# Patient Record
Sex: Male | Born: 1955 | Race: Black or African American | Hispanic: No | Marital: Married | Smoking: Never smoker
Health system: Southern US, Community
[De-identification: ages and names within clinical notes are randomized; demographics above are authoritative.]

## PROBLEM LIST (undated history)

## (undated) DIAGNOSIS — I10 Essential (primary) hypertension: Secondary | ICD-10-CM

## (undated) HISTORY — PX: ACHILLES TENDON REPAIR: SUR1153

---

## 2002-03-10 ENCOUNTER — Emergency Department (HOSPITAL_COMMUNITY): Admission: EM | Admit: 2002-03-10 | Discharge: 2002-03-10 | Payer: Self-pay | Admitting: Emergency Medicine

## 2002-07-10 ENCOUNTER — Encounter: Payer: Self-pay | Admitting: Emergency Medicine

## 2002-07-10 ENCOUNTER — Emergency Department (HOSPITAL_COMMUNITY): Admission: EM | Admit: 2002-07-10 | Discharge: 2002-07-10 | Payer: Self-pay | Admitting: Emergency Medicine

## 2006-04-24 ENCOUNTER — Ambulatory Visit (HOSPITAL_COMMUNITY): Admission: RE | Admit: 2006-04-24 | Discharge: 2006-04-25 | Payer: Self-pay | Admitting: Specialist

## 2007-05-18 ENCOUNTER — Emergency Department (HOSPITAL_COMMUNITY): Admission: EM | Admit: 2007-05-18 | Discharge: 2007-05-18 | Payer: Self-pay | Admitting: Emergency Medicine

## 2008-07-29 ENCOUNTER — Emergency Department (HOSPITAL_BASED_OUTPATIENT_CLINIC_OR_DEPARTMENT_OTHER): Admission: EM | Admit: 2008-07-29 | Discharge: 2008-07-29 | Payer: Self-pay | Admitting: Emergency Medicine

## 2008-12-08 ENCOUNTER — Emergency Department (HOSPITAL_BASED_OUTPATIENT_CLINIC_OR_DEPARTMENT_OTHER): Admission: EM | Admit: 2008-12-08 | Discharge: 2008-12-08 | Payer: Self-pay | Admitting: Emergency Medicine

## 2009-01-19 ENCOUNTER — Emergency Department (HOSPITAL_BASED_OUTPATIENT_CLINIC_OR_DEPARTMENT_OTHER): Admission: EM | Admit: 2009-01-19 | Discharge: 2009-01-19 | Payer: Self-pay | Admitting: Emergency Medicine

## 2009-01-19 ENCOUNTER — Ambulatory Visit: Payer: Self-pay | Admitting: Diagnostic Radiology

## 2009-09-07 ENCOUNTER — Emergency Department (HOSPITAL_BASED_OUTPATIENT_CLINIC_OR_DEPARTMENT_OTHER): Admission: EM | Admit: 2009-09-07 | Discharge: 2009-09-07 | Payer: Self-pay | Admitting: Emergency Medicine

## 2011-03-07 NOTE — Op Note (Signed)
NAME:  Leonard Osborn, Leonard Osborn               ACCOUNT NO.:  [id number]   MEDICAL RECORD NO.:  [id number]          PATIENT TYPE:  AMB   LOCATION:  SDS                          FACILITY:  [facility]   PHYSICIAN:  Kerrin Champagne, M.D.   DATE OF BIRTH:  07/13/1956   DATE OF PROCEDURE:  04/24/2006  DATE OF DISCHARGE:                                 OPERATIVE REPORT   PREOPERATIVE DIAGNOSIS:  Left tendo-Achilles rupture.   POSTOPERATIVE DIAGNOSIS:  Left tendo-Achilles rupture.   PROCEDURE:  Repair of left tendo-Achilles rupture using two #5 FiberWire  modified Kessler sutures and 0 Ethibond suture.   SURGEON:  Kerrin Champagne, M.D.   ASSISTANT:  None.   ANESTHESIA:  General via orotracheal intubation, Dr. Jacklynn Bue.   SPECIMENS:  None.   TOTAL TOURNIQUET TIME:  60 minutes at 300 mmHg.   ESTIMATED BLOOD LOSS:  50 mL   COMPLICATIONS:  None.   BRIEF CLINICAL HISTORY:  The patient is a 55 year old male playing  basketball five days ago, coming down from a shot, he felt a sudden give in  his left posterior ankle and had difficulty pushing off, thereafter, with a  noticeable defect in the left tendo-Achilles.  He was seen in the emergency  room and referred for evaluation.  Findings consistent with a left tendo-  Achilles rupture with defect in the left Achilles tendon about 4-6 inches  proximal to the calcaneus and positive Thompson's test, continuity of the  tendon.  The patient does, however, have plantar flexion related to the use  of his toe flexors.   INTEROPERATIVE FINDINGS:  A torn left tendo-Achilles approximately 4 inches  proximal to the attachment to the calcaneus.  This was repaired with two  modified Kesslers in the AP and the medial lateral plane.  Then, 0 Ethibond  sutures, repairing the toward edges together.  The plantaris tendon was used  to reinforce the repair.   DESCRIPTION OF PROCEDURE:  After adequate general anesthesia, the patient  rolled to a prone position, chest rolls  were used, standard preoperative  antibiotics.  All pressure points well-padded.  A tourniquet about the left  upper thigh.  Standard prep with DuraPrep solution from the left toes to the  knee, draped in the usual manner.  The incision, a curved S incision  extending from the medial aspect of the distal calf just above the  calcaneus, across the posterior aspect of the tendo-Achilles along the  posterior lateral aspect of the tendo-Achilles proximally, incision total  length approximately 8 inches, through skin and subcutaneous layers and  carried directly down to the peri-tenon and this was incised in line with  the skin.  The tendon distally and proximally was then carefully freed up.  The peri-tenon attachments left in place.  After freeing up the torn edges,  a #5 FiberWire was then passed in a modified Kessler fashion in the anterior  to posterior plane capturing the distal fragment and then another was passed  in the AP plane capturing the proximal fragment.  Then, #5 FiberWire's were  then passed in a medial to lateral  direction, again, placing a modified  Kessler stitch in this position in the distal stump and then in the proximal  stump, as well.  Then holding the foot plantar flex, the anterior to  posterior sutures were then carefully approximated and tied, maintaining  equal tension on each of the modified Kessler sutures and approximating the  tendinous structures.  Then, the medial and lateral sutures were carefully  tightened and sutured home.  This reapproximated the tendon quite nicely.  The slips of tendon that had been torn in a mop-like fashion were then  approximated to each of the ends overlapping the repaired area using 0  Ethibond sutures.  This was done circumferentially.  After completion of  this, the plantaris tendon was identified along the medial aspect of the  patient's tendo-Achilles. This was then carefully freed up distally and  passed through a hole in  the distal stump after making an opening with a 15  blade scalpel, passing it through the stump using a hemostat.  This was then  tied to the proximal stump using interrupted 0 Ethibond sutures and then  sutured to the distal stump, both over the lateral and medial aspects of the  stump using 0 Ethibond sutures and then proximally on the medial side  sutured to the proximal stump using 0 Ethibond sutures.  The peri-tenon was  then carefully closed over the repaired Achilles tendon area.  Irrigation  was performed and the subcu layers approximated with interrupted 0 Vicryl  sutures, more superficial subcutaneous layers with interrupted 2-0 Vicryl  sutures, and the skin closed with a running subcu stitch of 4-0 Vicryl.  Dermabond was then applied.  4x4s and ABD pad affixed to the skin with  sterile Webril and then a well padded posterior short leg splint was applied  with the ankle in approximately 20-25 degrees of plantar flexion.  After  application of this, the tourniquet was released.  Total tourniquet time was  60 minutes.  The patient was then returned to a supine position,  reactivated, extubated, and returned to the recovery room in satisfactory  condition.  All instrument and sponge counts were correct.      Kerrin Champagne, M.D.  Electronically Signed     JEN/MEDQ  D:  04/24/2006  T:  04/24/2006  Job:  [id number]

## 2011-07-22 LAB — RAPID STREP SCREEN (MED CTR MEBANE ONLY): Streptococcus, Group A Screen (Direct): NEGATIVE

## 2012-01-04 ENCOUNTER — Emergency Department (HOSPITAL_BASED_OUTPATIENT_CLINIC_OR_DEPARTMENT_OTHER)
Admission: EM | Admit: 2012-01-04 | Discharge: 2012-01-04 | Disposition: A | Discharge status: Home / Self Care | Payer: PRIVATE HEALTH INSURANCE | Attending: Emergency Medicine | Admitting: Emergency Medicine

## 2012-01-04 ENCOUNTER — Encounter (HOSPITAL_BASED_OUTPATIENT_CLINIC_OR_DEPARTMENT_OTHER): Payer: Self-pay | Admitting: *Deleted

## 2012-01-04 DIAGNOSIS — R0789 Other chest pain: Secondary | ICD-10-CM | POA: Insufficient documentation

## 2012-01-04 DIAGNOSIS — J45901 Unspecified asthma with (acute) exacerbation: Secondary | ICD-10-CM | POA: Insufficient documentation

## 2012-01-04 DIAGNOSIS — R0602 Shortness of breath: Secondary | ICD-10-CM | POA: Insufficient documentation

## 2012-01-04 DIAGNOSIS — I10 Essential (primary) hypertension: Secondary | ICD-10-CM | POA: Insufficient documentation

## 2012-01-04 DIAGNOSIS — R062 Wheezing: Secondary | ICD-10-CM

## 2012-01-04 HISTORY — DX: Essential (primary) hypertension: I10

## 2012-01-04 MED ORDER — ALBUTEROL SULFATE HFA 108 (90 BASE) MCG/ACT IN AERS
2.0000 | INHALATION_SPRAY | RESPIRATORY_TRACT | Status: DC | PRN
  Administered 2012-01-04: 2 via RESPIRATORY_TRACT
  Filled 2012-01-04: qty 6.7

## 2012-01-04 MED ORDER — PREDNISONE 20 MG PO TABS: 60.0000 mg | ORAL_TABLET | Freq: Every day | ORAL | Status: AC

## 2012-01-04 MED ORDER — ALBUTEROL SULFATE (5 MG/ML) 0.5% IN NEBU
5.0000 mg | INHALATION_SOLUTION | Freq: Once | RESPIRATORY_TRACT | Status: AC
  Administered 2012-01-04: 5 mg via RESPIRATORY_TRACT
  Filled 2012-01-04: qty 1

## 2012-01-04 MED ORDER — ALBUTEROL SULFATE HFA 108 (90 BASE) MCG/ACT IN AERS: 2.0000 | INHALATION_SPRAY | RESPIRATORY_TRACT | Status: DC | PRN

## 2012-01-04 NOTE — ED Notes (Signed)
Pt states that he feels much better after admin of neb tx, wants to go home.  Dr [place] Lank informed, d/c pending.

## 2012-01-04 NOTE — ED Notes (Signed)
Pt reports feeling sob for 3-4 days- maintenence meds not helping

## 2012-01-04 NOTE — ED Provider Notes (Signed)
History    This chart was scribed for Leonard Roots, MD, MD by Smitty Pluck. The patient was seen in room MH05 and the patient's care was started at 8:50PM.   CSN: [id number]  Arrival date & time 01/04/12  8657   First MD Initiated Contact with Patient 01/04/12 2051      Chief Complaint  Patient presents with  . Shortness of Breath    (Consider location/radiation/quality/duration/timing/severity/associated sxs/prior treatment) Patient is a 56 y.o. male presenting with shortness of breath. The history is provided by the patient.  Shortness of Breath  Associated symptoms include shortness of breath. Pertinent negatives include no chest pain and no sore throat.   Leonard Osborn is a 56 y.o. male who presents to the Emergency Department complaining of moderate SOB onset 3-4 days. Pt reports having cold recently. He has hx of asthma. Pt reports occasional mild coughing this morning. No fever.  He states that he feels tightwheezing in his chest. He denies chest pain, rhinorrhea and sore throat. He has never had to be admitted to hospital for asthma. He denies abdominal pain, diarrhea, nausea, vomiting and constipation. Pt reports that inhaler is not alleviating symptoms.  Of note pt has been using flovent as rescue inhaler and not experiencing relief of symptoms, he is out of albuterol. No leg pain or swelling. No orthopnea or pnd.   Past Medical History  Diagnosis Date  . Asthma   . Hypertension     Past Surgical History  Procedure Date  . Achilles tendon repair     No family history on file.  History  Substance Use Topics  . Smoking status: Never Smoker   . Smokeless tobacco: Never Used  . Alcohol Use: 1.2 oz/week    2 Glasses of wine per week      Review of Systems  HENT: Negative for sore throat.   Respiratory: Positive for shortness of breath.   Cardiovascular: Negative for chest pain and leg swelling.  All other systems reviewed and are negative.  10 Systems  reviewed and are negative for acute change except as noted in the HPI.   Allergies  Sulfa antibiotics  Home Medications  No current outpatient prescriptions on file.  BP 134/84  Pulse 60  Temp(Src) 97.6 F (36.4 C) (Oral)  Ht 6' 2.5" (1.892 m)  Wt 215 lb (97.523 kg)  BMI 27.23 kg/m2  SpO2 99%  Physical Exam  Nursing note and vitals reviewed. Constitutional: He is oriented to person, place, and time. He appears well-developed and well-nourished. No distress.  HENT:  Head: Normocephalic and atraumatic.  Mouth/Throat: Oropharynx is clear and moist.  Eyes: EOM are normal. Pupils are equal, round, and reactive to light.  Neck: Normal range of motion. Neck supple. No tracheal deviation present.  Cardiovascular: Normal rate, regular rhythm, normal heart sounds and intact distal pulses.  Exam reveals no gallop and no friction rub.   No murmur heard. Pulmonary/Chest: Effort normal. No accessory muscle usage. No respiratory distress. He has wheezes (mild expiratory ).       Mild exp wheezing bil. Good air exchange.   Abdominal: Soft. He exhibits no distension. There is no tenderness.  Musculoskeletal: Normal range of motion. He exhibits no edema and no tenderness.  Neurological: He is alert and oriented to person, place, and time.  Skin: Skin is warm and dry.  Psychiatric: He has a normal mood and affect. His behavior is normal.    ED Course  Procedures (including critical care time)  DIAGNOSTIC STUDIES: Oxygen Saturation is 99% on room air, normal by my interpretation.    COORDINATION OF CARE:      MDM  Albuterol neb. Recheck no wheezing. No increased wob.    Pt states breathing at baseline, requests d/c.       Leonard Roots, MD 01/04/12 2151

## 2012-01-04 NOTE — Discharge Instructions (Signed)
Use albuterol inhaler as need if wheezing. Take prednisone as prescribed.  Follow up with primary care doctor in the next couple days. Return to ER if worse, trouble breathing, fevers, chest pain, other concern.      Asthma Attack Prevention HOW CAN ASTHMA BE PREVENTED? Currently, there is no way to prevent asthma from starting. However, you can take steps to control the disease and prevent its symptoms after you have been diagnosed. Learn about your asthma and how to control it. Take an active role to control your asthma by working with your caregiver to create and follow an asthma action plan. An asthma action plan guides you in taking your medicines properly, avoiding factors that make your asthma worse, tracking your level of asthma control, responding to worsening asthma, and seeking emergency care when needed. To track your asthma, keep records of your symptoms, check your peak flow number using a peak flow meter (handheld device that shows how well air moves out of your lungs), and get regular asthma checkups.  Other ways to prevent asthma attacks include:  Use medicines as your caregiver directs.   Identify and avoid things that make your asthma worse (as much as you can).   Keep track of your asthma symptoms and level of control.   Get regular checkups for your asthma.   With your caregiver, write a detailed plan for taking medicines and managing an asthma attack. Then be sure to follow your action plan. Asthma is an ongoing condition that needs regular monitoring and treatment.   Identify and avoid asthma triggers. A number of outdoor allergens and irritants (pollen, mold, cold air, air pollution) can trigger asthma attacks. Find out what causes or makes your asthma worse, and take steps to avoid those triggers (see below).   Monitor your breathing. Learn to recognize warning signs of an attack, such as slight coughing, wheezing or shortness of breath. However, your lung function may  already decrease before you notice any signs or symptoms, so regularly measure and record your peak airflow with a home peak flow meter.   Identify and treat attacks early. If you act quickly, you're less likely to have a severe attack. You will also need less medicine to control your symptoms. When your peak flow measurements decrease and alert you to an upcoming attack, take your medicine as instructed, and immediately stop any activity that may have triggered the attack. If your symptoms do not improve, get medical help.   Pay attention to increasing quick-relief inhaler use. If you find yourself relying on your quick-relief inhaler (such as albuterol), your asthma is not under control. See your caregiver about adjusting your treatment.  IDENTIFY AND CONTROL FACTORS THAT MAKE YOUR ASTHMA WORSE A number of common things can set off or make your asthma symptoms worse (asthma triggers). Keep track of your asthma symptoms for several weeks, detailing all the environmental and emotional factors that are linked with your asthma. When you have an asthma attack, go back to your asthma diary to see which factor, or combination of factors, might have contributed to it. Once you know what these factors are, you can take steps to control many of them.  Allergies: If you have allergies and asthma, it is important to take asthma prevention steps at home. Asthma attacks (worsening of asthma symptoms) can be triggered by allergies, which can cause temporary increased inflammation of your airways. Minimizing contact with the substance to which you are allergic will help prevent an asthma attack.  Animal Dander:   Some people are allergic to the flakes of skin or dried saliva from animals with fur or feathers. Keep these pets out of your home.   If you can't keep a pet outdoors, keep the pet out of your bedroom and other sleeping areas at all times, and keep the door closed.   Remove carpets and furniture covered with  cloth from your home. If that is not possible, keep the pet away from fabric-covered furniture and carpets.  Dust Mites:  Many people with asthma are allergic to dust mites. Dust mites are tiny bugs that are found in every home, in mattresses, pillows, carpets, fabric-covered furniture, bedcovers, clothes, stuffed toys, fabric, and other fabric-covered items.   Cover your mattress in a special dust-proof cover.   Cover your pillow in a special dust-proof cover, or wash the pillow each week in hot water. Water must be hotter than 130 F to kill dust mites. Cold or warm water used with detergent and bleach can also be effective.   Wash the sheets and blankets on your bed each week in hot water.   Try not to sleep or lie on cloth-covered cushions.   Call ahead when traveling and ask for a smoke-free hotel room. Bring your own bedding and pillows, in case the hotel only supplies feather pillows and down comforters, which may contain dust mites and cause asthma symptoms.   Remove carpets from your bedroom and those laid on concrete, if you can.   Keep stuffed toys out of the bed, or wash the toys weekly in hot water or cooler water with detergent and bleach.  Cockroaches:  Many people with asthma are allergic to the droppings and remains of cockroaches.   Keep food and garbage in closed containers. Never leave food out.   Use poison baits, traps, powders, gels, or paste (for example, boric acid).   If a spray is used to kill cockroaches, stay out of the room until the odor goes away.  Indoor Mold:  Fix leaky faucets, pipes, or other sources of water that have mold around them.   Clean moldy surfaces with a cleaner that has bleach in it.  Pollen and Outdoor Mold:  When pollen or mold spore counts are high, try to keep your windows closed.   Stay indoors with windows closed from late morning to afternoon, if you can. Pollen and some mold spore counts are highest at that time.   Ask  your caregiver whether you need to take or increase anti-inflammatory medicine before your allergy season starts.  Irritants:   Tobacco smoke is an irritant. If you smoke, ask your caregiver how you can quit. Ask family members to quit smoking, too. Do not allow smoking in your home or car.   If possible, do not use a wood-burning stove, kerosene heater, or fireplace. Minimize exposure to all sources of smoke, including incense, candles, fires, and fireworks.   Try to stay away from strong odors and sprays, such as perfume, talcum powder, hair spray, and paints.   Decrease humidity in your home and use an indoor air cleaning device. Reduce indoor humidity to below 60 percent. Dehumidifiers or central air conditioners can do this.   Try to have someone else vacuum for you once or twice a week, if you can. Stay out of rooms while they are being vacuumed and for a short while afterward.   If you vacuum, use a dust mask from a hardware store, a double-layered or  microfilter vacuum cleaner bag, or a vacuum cleaner with a HEPA filter.   Sulfites in foods and beverages can be irritants. Do not drink beer or wine, or eat dried fruit, processed potatoes, or shrimp if they cause asthma symptoms.   Cold air can trigger an asthma attack. Cover your nose and mouth with a scarf on cold or windy days.   Several health conditions can make asthma more difficult to manage, including runny nose, sinus infections, reflux disease, psychological stress, and sleep apnea. Your caregiver will treat these conditions, as well.   Avoid close contact with people who have a cold or the flu, since your asthma symptoms may get worse if you catch the infection from them. Wash your hands thoroughly after touching items that may have been handled by people with a respiratory infection.   Get a flu shot every year to protect against the flu virus, which often makes asthma worse for days or weeks. Also get a pneumonia shot once  every five to 10 years.  Drugs:  Aspirin and other painkillers can cause asthma attacks. 10% to 20% of people with asthma have sensitivity to aspirin or a group of painkillers called non-steroidal anti-inflammatory drugs (NSAIDS), such as ibuprofen and naproxen. These drugs are used to treat pain and reduce fevers. Asthma attacks caused by any of these medicines can be severe and even fatal. These drugs must be avoided in people who have known aspirin sensitive asthma. Products with acetaminophen are considered safe for people who have asthma. It is important that people with aspirin sensitivity read labels of all over-the-counter drugs used to treat pain, colds, coughs, and fever.   Beta blockers and ACE inhibitors are other drugs which you should discuss with your caregiver, in relation to your asthma.  ALLERGY SKIN TESTING  Ask your asthma caregiver about allergy skin testing or blood testing (RAST test) to identify the allergens to which you are sensitive. If you are found to have allergies, allergy shots (immunotherapy) for asthma may help prevent future allergies and asthma. With allergy shots, small doses of allergens (substances to which you are allergic) are injected under your skin on a regular schedule. Over a period of time, your body may become used to the allergen and less responsive with asthma symptoms. You can also take measures to minimize your exposure to those allergens. EXERCISE  If you have exercise-induced asthma, or are planning vigorous exercise, or exercise in cold, humid, or dry environments, prevent exercise-induced asthma by following your caregiver's advice regarding asthma treatment before exercising. Document Released: 09/24/2009 Document Revised: 09/25/2011 Document Reviewed: 09/24/2009 [facility] Patient Information 2012 [place], [place].      Asthma, Adult Asthma is caused by narrowing of the air passages in the lungs. It may be triggered by pollen, dust, animal  dander, molds, some foods, respiratory infections, exposure to smoke, exercise, emotional stress or other allergens (things that cause allergic reactions or allergies). Repeat attacks are common. HOME CARE INSTRUCTIONS   Use prescription medications as ordered by your caregiver.   Avoid pollen, dust, animal dander, molds, smoke and other things that cause attacks at home and at work.   You may have fewer attacks if you decrease dust in your home. Electrostatic air cleaners may help.   It may help to replace your pillows or mattress with materials less likely to cause allergies.   Talk to your caregiver about an action plan for managing asthma attacks at home, including, the use of a peak flow meter  which measures the severity of your asthma attack. An action plan can help minimize or stop the attack without having to seek medical care.   If you are not on a fluid restriction, drink 8 to 10 glasses of water each day.   Always have a plan prepared for seeking medical attention, including, calling your physician, accessing local emergency care, and calling 911 (in the [place] for a severe attack.   Discuss possible exercise routines with your caregiver.   If animal dander is the cause of asthma, you may need to get rid of pets.  SEEK MEDICAL CARE IF:   You have wheezing and shortness of breath even if taking medicine to prevent attacks.   You have muscle aches, chest pain or thickening of sputum.   Your sputum changes from clear or white to yellow, green, gray, or bloody.   You have any problems that may be related to the medicine you are taking (such as a rash, itching, swelling or trouble breathing).  SEEK IMMEDIATE MEDICAL CARE IF:   Your usual medicines do not stop your wheezing or there is increased coughing and/or shortness of breath.   You have increased difficulty breathing.   You have a fever.  MAKE SURE YOU:   Understand these instructions.   Will watch your condition.     Will get help right away if you are not doing well or get worse.  Document Released: 10/06/2005 Document Revised: 09/25/2011 Document Reviewed: 05/24/2008 [facility] Patient Information 2012 [place], [place].

## 2012-01-04 NOTE — ED Notes (Signed)
Pt presented to the ED with some [facility] but pt states that he was only given a Flovent inhaler which he is using as a recuse inhaler but is not getting relief. Pt states he was not given an Albuterol inhaler at the MD office. Pt was notified per [facility]RT while in ED  that the Flovent inhaler is a maintenance RX. Pt is no respiratory distress.

## 2012-04-26 ENCOUNTER — Encounter (HOSPITAL_BASED_OUTPATIENT_CLINIC_OR_DEPARTMENT_OTHER): Payer: Self-pay | Admitting: Family Medicine

## 2012-04-26 ENCOUNTER — Emergency Department (HOSPITAL_BASED_OUTPATIENT_CLINIC_OR_DEPARTMENT_OTHER)
Admission: EM | Admit: 2012-04-26 | Discharge: 2012-04-26 | Disposition: A | Discharge status: Home / Self Care | Payer: PRIVATE HEALTH INSURANCE | Attending: Emergency Medicine | Admitting: Emergency Medicine

## 2012-04-26 DIAGNOSIS — J45909 Unspecified asthma, uncomplicated: Secondary | ICD-10-CM | POA: Insufficient documentation

## 2012-04-26 DIAGNOSIS — H109 Unspecified conjunctivitis: Secondary | ICD-10-CM | POA: Insufficient documentation

## 2012-04-26 DIAGNOSIS — I10 Essential (primary) hypertension: Secondary | ICD-10-CM | POA: Insufficient documentation

## 2012-04-26 MED ORDER — TETRACAINE HCL 0.5 % OP SOLN
1.0000 [drp] | Freq: Once | OPHTHALMIC | Status: AC
  Administered 2012-04-26: 1 [drp] via OPHTHALMIC
  Filled 2012-04-26: qty 2

## 2012-04-26 MED ORDER — FLUORESCEIN SODIUM 1 MG OP STRP
1.0000 | ORAL_STRIP | Freq: Once | OPHTHALMIC | Status: AC
  Administered 2012-04-26: 1 via OPHTHALMIC
  Filled 2012-04-26: qty 1

## 2012-04-26 MED ORDER — ERYTHROMYCIN 5 MG/GM OP OINT: TOPICAL_OINTMENT | OPHTHALMIC | Status: AC

## 2012-04-26 NOTE — ED Notes (Signed)
Pt c/o left eye red, uncomfortable and drainage x 3 days. Pt sts vision is "normal". Pt has been using wife's gentamicin eye drops and visine.

## 2012-04-26 NOTE — ED Provider Notes (Signed)
History     CSN: [id number]  Arrival date & time 04/26/12  0804   First MD Initiated Contact with Patient 04/26/12 [phone]      Chief Complaint  Patient presents with  . Conjunctivitis    (Consider location/radiation/quality/duration/timing/severity/associated sxs/prior treatment) HPI  Left eye watering and itching x 3 days, progressively worsening. No pain in eye. No visual changes. No FB changes. No rhinorrhea or recent illness. H/o asthma, currently well controlled. Has been using his wife's gentamycin eye gtt and visine with min relief. Denies headache, dizziness.   D Notes, ED Provider Notes from 04/26/12 0000 to 04/26/12 08:18:12       Avanell Shackleton, RN 04/26/2012 08:17      Pt c/o left eye red, uncomfortable and drainage x 3 days. Pt sts vision is "normal". Pt has been using wife's gentamicin eye drops and visine.    Past Medical History  Diagnosis Date  . Asthma   . Hypertension     Past Surgical History  Procedure Date  . Achilles tendon repair     No family history on file.  History  Substance Use Topics  . Smoking status: Never Smoker   . Smokeless tobacco: Never Used  . Alcohol Use: 1.2 oz/week    2 Glasses of wine per week      Review of Systems  All other systems reviewed and are negative.   except as noted HPI   Allergies  Sulfa antibiotics  Home Medications   Current Outpatient Rx  Name Route Sig Dispense Refill  . VITAMIN C 100 MG PO TABS Oral Take 100 mg by mouth daily.    . ASPIRIN 81 MG PO TABS Oral Take 81 mg by mouth daily.    Marland Kitchen FISH OIL PO Oral Take 1 capsule by mouth daily.    . ALBUTEROL SULFATE HFA 108 (90 BASE) MCG/ACT IN AERS Inhalation Inhale 2 puffs into the lungs every 4 (four) hours as needed for wheezing. 1 Inhaler 3  . ERYTHROMYCIN 5 MG/GM OP OINT  Place a 1/2 inch ribbon of ointment into the lower eyelid 4 times daily x 5-7 days 1 g 0  . FERROUS SULFATE PO Oral Take 1 tablet by mouth daily.    Marland Kitchen FLUTICASONE PROPIONATE   HFA 110 MCG/ACT IN AERO Inhalation Inhale 1 puff into the lungs 2 (two) times daily.    Marland Kitchen HYDROCHLOROTHIAZIDE 25 MG PO TABS Oral Take 25 mg by mouth daily.      BP 136/92  Pulse 55  Temp 98.1 F (36.7 C) (Oral)  Resp 14  Ht 6\' 2"  (1.88 m)  Wt 205 lb (92.987 kg)  BMI 26.32 kg/m2  SpO2 100%  Physical Exam  Nursing note and vitals reviewed. Constitutional: He is oriented to person, place, and time. He appears well-developed and well-nourished. No distress.  HENT:  Head: Atraumatic.  Mouth/Throat: Oropharynx is clear and moist.  Eyes: Pupils are equal, round, and reactive to light. Left eye exhibits discharge.       Left eye with watery discharge. Diffuse erythema. Pupils equal without photophobia. Under UV light no uptake of fluorescein. There is no foreign body visualized a dilated.  Visual acuity 20/20 b/l  Neck: Neck supple.  Cardiovascular: Normal rate, regular rhythm, normal heart sounds and intact distal pulses.  Exam reveals no gallop and no friction rub.   No murmur heard. Pulmonary/Chest: Effort normal. No respiratory distress. He has no wheezes. He has no rales.  Abdominal: Soft. Bowel sounds are  normal. There is no tenderness. There is no rebound and no guarding.  Musculoskeletal: Normal range of motion. He exhibits no edema and no tenderness.  Neurological: He is alert and oriented to person, place, and time.  Skin: Skin is warm and dry.  Psychiatric: He has a normal mood and affect.    ED Course  Procedures (including critical care time)  Labs Reviewed - No data to display No results found.   1. Conjunctivitis     MDM  Erythromycin ointment. No concern for TA, glaucoma. No FB visualized. Historty and exam not c/w iritis.No EMC precluding discharge at this time. Given Precautions for return. PMD f/u.         Forbes Cellar, MD 04/26/12 [phone]

## 2015-03-09 ENCOUNTER — Encounter (HOSPITAL_BASED_OUTPATIENT_CLINIC_OR_DEPARTMENT_OTHER): Payer: Self-pay | Admitting: *Deleted

## 2015-03-09 ENCOUNTER — Emergency Department (HOSPITAL_BASED_OUTPATIENT_CLINIC_OR_DEPARTMENT_OTHER)
Admission: EM | Admit: 2015-03-09 | Discharge: 2015-03-10 | Disposition: A | Discharge status: Home / Self Care | Payer: PRIVATE HEALTH INSURANCE | Attending: Emergency Medicine | Admitting: Emergency Medicine

## 2015-03-09 DIAGNOSIS — Z7982 Long term (current) use of aspirin: Secondary | ICD-10-CM | POA: Diagnosis not present

## 2015-03-09 DIAGNOSIS — Z79899 Other long term (current) drug therapy: Secondary | ICD-10-CM | POA: Diagnosis not present

## 2015-03-09 DIAGNOSIS — J45909 Unspecified asthma, uncomplicated: Secondary | ICD-10-CM | POA: Insufficient documentation

## 2015-03-09 DIAGNOSIS — I10 Essential (primary) hypertension: Secondary | ICD-10-CM | POA: Diagnosis not present

## 2015-03-09 DIAGNOSIS — Z7951 Long term (current) use of inhaled steroids: Secondary | ICD-10-CM | POA: Diagnosis not present

## 2015-03-09 NOTE — ED Notes (Signed)
HTN that started today.  Denies HA

## 2015-03-10 MED ORDER — AMLODIPINE BESYLATE 5 MG PO TABS: 5.0000 mg | ORAL_TABLET | Freq: Every day | ORAL | Status: AC

## 2015-03-10 NOTE — ED Provider Notes (Signed)
CSN: [id number]     Arrival date & time 03/09/15  2335 History   First MD Initiated Contact with Patient 03/09/15 2354     Chief Complaint  Patient presents with  . Hypertension     (Consider location/radiation/quality/duration/timing/severity/associated sxs/prior Treatment) HPI Comments: Patient is a 59 year old male with history of hypertension. He had recent medication changes related to elevated creatinine. His lisinopril was changed to valsartan several weeks ago. Since that time he has noticed he is running higher blood pressures been normal. At home he is getting readings of nearly 200 systolic over 100 diastolic. He states he has intermittent headaches but denies any chest pain or difficulty breathing. He denies any swelling of his ankles.  Patient is a 59 y.o. male presenting with hypertension. The history is provided by the patient.  Hypertension This is a chronic problem. Episode onset: 1 week ago. The problem occurs constantly. The problem has been gradually worsening. Associated symptoms include headaches. Pertinent negatives include no chest pain and no shortness of breath. Nothing aggravates the symptoms. Nothing relieves the symptoms. He has tried nothing for the symptoms. The treatment provided no relief.    Past Medical History  Diagnosis Date  . Asthma   . Hypertension    Past Surgical History  Procedure Laterality Date  . Achilles tendon repair     History reviewed. No pertinent family history. History  Substance Use Topics  . Smoking status: Never Smoker   . Smokeless tobacco: Never Used  . Alcohol Use: 1.2 oz/week    2 Glasses of wine per week    Review of Systems  Respiratory: Negative for shortness of breath.   Cardiovascular: Negative for chest pain.  Neurological: Positive for headaches.  All other systems reviewed and are negative.     Allergies  Sulfa antibiotics  Home Medications   Prior to Admission medications   Medication Sig Start  Date End Date Taking? Authorizing Provider  valsartan (DIOVAN) 160 MG tablet Take 160 mg by mouth daily.   Yes Historical Provider, MD  Ascorbic Acid (VITAMIN C) 100 MG tablet Take 100 mg by mouth daily.    Historical Provider, MD  aspirin 81 MG tablet Take 81 mg by mouth daily.    Historical Provider, MD  FERROUS SULFATE PO Take 1 tablet by mouth daily.    Historical Provider, MD  fluticasone (FLOVENT HFA) 110 MCG/ACT inhaler Inhale 1 puff into the lungs 2 (two) times daily.    Historical Provider, MD  Omega-3 Fatty Acids (FISH OIL PO) Take 1 capsule by mouth daily.    Historical Provider, MD   BP 173/99 mmHg  Pulse 61  Temp(Src) 98.2 F (36.8 C) (Oral)  Resp 18  Ht 6\' 2"  (1.88 m)  Wt 213 lb (96.616 kg)  BMI 27.34 kg/m2  SpO2 100% Physical Exam  Constitutional: He is oriented to person, place, and time. He appears well-developed and well-nourished. No distress.  HENT:  Head: Normocephalic and atraumatic.  Eyes: EOM are normal. Pupils are equal, round, and reactive to light.  Neck: Normal range of motion. Neck supple.  Cardiovascular: Normal rate, regular rhythm and normal heart sounds.   No murmur heard. Pulmonary/Chest: Effort normal and breath sounds normal. No respiratory distress. He has no wheezes.  Abdominal: Soft. Bowel sounds are normal.  Musculoskeletal: Normal range of motion. He exhibits no edema.  Neurological: He is alert and oriented to person, place, and time. No cranial nerve deficit. Coordination normal.  Skin: Skin is warm and dry.  He is not diaphoretic.  Nursing note and vitals reviewed.   ED Course  Procedures (including critical care time) Labs Review Labs Reviewed - No data to display  Imaging Review No results found.   EKG Interpretation None      MDM   Final diagnoses:  None    Blood pressure here is 173/99. Patient has no primary care doctor and has his prescriptions filled at urgent care clinics. I see nothing today that appears emergent.  He is neurologically intact and gives me no symptoms that would be suggestive of and organ damage. Due to his elevated readings at home and lack of primary care, I will prescribe a low dose of Norvasc and have him follow his blood pressures. He has been advised to obtain a primary care physician for further management of his hypertension.    Geoffery Lyons, MD 03/10/15 [phone]

## 2015-03-10 NOTE — Discharge Instructions (Signed)
Add Norvasc as prescribed.  Please obtain a primary care physician for further management of your blood pressure.  Return to the ER if your symptoms significantly worsen or change.   Hypertension Hypertension, commonly called high blood pressure, is when the force of blood pumping through your arteries is too strong. Your arteries are the blood vessels that carry blood from your heart throughout your body. A blood pressure reading consists of a higher number over a lower number, such as 110/72. The higher number (systolic) is the pressure inside your arteries when your heart pumps. The lower number (diastolic) is the pressure inside your arteries when your heart relaxes. Ideally you want your blood pressure below 120/80. Hypertension forces your heart to work harder to pump blood. Your arteries may become narrow or stiff. Having hypertension puts you at risk for heart disease, stroke, and other problems.  RISK FACTORS Some risk factors for high blood pressure are controllable. Others are not.  Risk factors you cannot control include:   Race. You may be at higher risk if you are African American.  Age. Risk increases with age.  Gender. Men are at higher risk than women before age 88 years. After age 4, women are at higher risk than men. Risk factors you can control include:  Not getting enough exercise or physical activity.  Being overweight.  Getting too much fat, sugar, calories, or salt in your diet.  Drinking too much alcohol. SIGNS AND SYMPTOMS Hypertension does not usually cause signs or symptoms. Extremely high blood pressure (hypertensive crisis) may cause headache, anxiety, shortness of breath, and nosebleed. DIAGNOSIS  To check if you have hypertension, your health care provider will measure your blood pressure while you are seated, with your arm held at the level of your heart. It should be measured at least twice using the same arm. Certain conditions can cause a difference  in blood pressure between your right and left arms. A blood pressure reading that is higher than normal on one occasion does not mean that you need treatment. If one blood pressure reading is high, ask your health care provider about having it checked again. TREATMENT  Treating high blood pressure includes making lifestyle changes and possibly taking medicine. Living a healthy lifestyle can help lower high blood pressure. You may need to change some of your habits. Lifestyle changes may include:  Following the DASH diet. This diet is high in fruits, vegetables, and whole grains. It is low in salt, red meat, and added sugars.  Getting at least 2 hours of brisk physical activity every week.  Losing weight if necessary.  Not smoking.  Limiting alcoholic beverages.  Learning ways to reduce stress. If lifestyle changes are not enough to get your blood pressure under control, your health care provider may prescribe medicine. You may need to take more than one. Work closely with your health care provider to understand the risks and benefits. HOME CARE INSTRUCTIONS  Have your blood pressure rechecked as directed by your health care provider.   Take medicines only as directed by your health care provider. Follow the directions carefully. Blood pressure medicines must be taken as prescribed. The medicine does not work as well when you skip doses. Skipping doses also puts you at risk for problems.   Do not smoke.   Monitor your blood pressure at home as directed by your health care provider. SEEK MEDICAL CARE IF:   You think you are having a reaction to medicines taken.  You have  recurrent headaches or feel dizzy.  You have swelling in your ankles.  You have trouble with your vision. SEEK IMMEDIATE MEDICAL CARE IF:  You develop a severe headache or confusion.  You have unusual weakness, numbness, or feel faint.  You have severe chest or abdominal pain.  You vomit  repeatedly.  You have trouble breathing. MAKE SURE YOU:   Understand these instructions.  Will watch your condition.  Will get help right away if you are not doing well or get worse. Document Released: 10/06/2005 Document Revised: 02/20/2014 Document Reviewed: 07/29/2013 [facility] Patient Information 2015 [place], [place]. This information is not intended to replace advice given to you by your health care provider. Make sure you discuss any questions you have with your health care provider.    Emergency Department Resource Guide 1) Find a Doctor and Pay Out of Pocket Although you won't have to find out who is covered by your insurance plan, it is a good idea to ask around and get recommendations. You will then need to call the office and see if the doctor you have chosen will accept you as a new patient and what types of options they offer for patients who are self-pay. Some doctors offer discounts or will set up payment plans for their patients who do not have insurance, but you will need to ask so you aren't surprised when you get to your appointment.  2) Contact Your Local Health Department Not all health departments have doctors that can see patients for sick visits, but many do, so it is worth a call to see if yours does. If you don't know where your local health department is, you can check in your phone book. The CDC also has a tool to help you locate your state's health department, and many state websites also have listings of all of their local health departments.  3) Find a Walk-in Clinic If your illness is not likely to be very severe or complicated, you may want to try a walk in clinic. These are popping up all over the country in pharmacies, drugstores, and shopping centers. They're usually staffed by nurse practitioners or physician assistants that have been trained to treat common illnesses and complaints. They're usually fairly quick and inexpensive. However, if you have serious  medical issues or chronic medical problems, these are probably not your best option.  No Primary Care Doctor: - Call Health Connect at  [phone] - they can help you locate a primary care doctor that  accepts your insurance, provides certain services, etc. - Physician Referral Service- [phone]  Chronic Pain Problems: Organization         Address  Phone   Notes  [facility]  [phone] Patients need to be referred by their primary care doctor.   Medication Assistance: Organization         Address  Phone   Notes  [facility]ication [facility] [address] [place], [place] [phone] --Must be a resident of [facility] -- Must have NO insurance coverage whatsoever (no Medicaid/ Medicare, etc.) -- The pt. MUST have a primary care doctor that directs their care regularly and follows them in the community   [facility]ist  [phone]   [employer]  [phone]) [phone]    Agencies that provide inexpensive medical care: Organization         Address  Phone   Notes  [facility]  [phone]  [facility]    [phone]   [facility] [address] [place], [place] [address] [phone]   [facility] [address] [place], [place] [phone]   Planned Parenthood    [phone]   [facility]    [phone]   Community [facility] and [facility]  [address], [place] Phone:  [phone], Fax:  [phone] Hours of Operation:  9 am - 6 pm, M-F.  Also accepts Medicaid/Medicare and self-pay.  [facility]  [address], [place] Phone: [phone], Fax: [phone]. Hours of Operation:  8:30 am - 5:30 pm, M-F.  Also accepts Medicaid and self-pay.  [facility] [place] [address], [place] Phone: [phone]   [facility] [address] Natasha Bence  [place], [place] [phone], Ext. [phone] Mondays & Thursdays: 7-9 AM.  First 15 patients are seen on a first come, first serve basis.    Medicaid-accepting [facility] Providers:  Organization         Address  Phone   Notes  [facility] [address],  [phone] Also accepts self-pay patients.  [facility] [address] Laurell Josephs [place], [place]  [phone]   [facility] [address], [place] [phone]   [facility] [address] [place] [phone]   Renaye Rakers [address], [place]   [phone] Only accepts [place] patients after they have their name applied to their card.   Self-Pay (no insurance) in [facility]:  Organization         Address  Phone   Notes  Sickle Cell Patients, [facility] [address] [place], [place] [phone]   [facility] [address] [place], [place] [phone]   [facility]  [address], [place] [phone]   [facility]/Dr. Osei-Bonsu  [address], [place] or [address], [place] [phone] Phone number for both [place] and [place] locations is the same.  [facility] [address], [place] [phone]   [facility] [address] [place] or [address] [phone]   [facility] [address], [place] [phone], phone; [phone], fax Sees patients 1st and 3rd Saturday of every month.  Must not qualify for public or private insurance (i.e. Medicaid, Medicare, [place] Health Choice, Veterans' Benefits)  Household income should be no more than 200% of the poverty level The clinic cannot treat you if you are pregnant or think you are pregnant  Sexually  transmitted diseases are not treated at the clinic.    Dental Care: Organization         Address  Phone  Notes  [facility] Department of [facility] [address] [place], [place] [phone] Accepts children up to age 15 who are enrolled in [place] or [place] Health Choice; pregnant women with a Medicaid card; and children who have applied for Medicaid or [place] Health Choice, but were declined, whose parents can pay a reduced fee at time of service.  [facility] Department of [facility]  [address],  [place] [phone] Accepts children up to age 48 who are enrolled in Medicaid or [place] Health Choice; pregnant women with a Medicaid card; and children who have applied for Medicaid or [place] Health Choice, but were declined, whose parents can pay a reduced fee at time of service.  [facility]  [address] [place], [place] [phone] Patients are seen by appointment only. Walk-ins are not accepted. [facility] will see patients 24 years of age and older. Monday - Tuesday (8am-5pm) Most Wednesdays (8:30-5pm) $30 per visit, cash only  [facility] Adult Dental Access PROGRAM  [address], [facility] [phone] Patients are seen by appointment only. Walk-ins are not accepted. [facility] will see patients 101 years of age and older. One Wednesday Evening (Monthly: Volunteer Based).  $30 per visit, cash only  [employer]  [phone] for adults; Children under age 89, call Graduate Pediatric Dentistry at [phone]. Children aged 56-14, please call [phone] to request a pediatric application.  Dental services are provided in all areas of dental care including fillings, crowns and bridges, complete and partial dentures, implants, gum treatment, root canals, and extractions. Preventive care is also provided. Treatment is provided to both adults and children. Patients are  selected via a lottery and there is often a waiting list.   [facility] [address], [place]  [phone] [email or web address]   [facility] [address] [place], [place] [phone], Ext. [phone] Second and Fourth Thursday of each month, opens at 6:30 AM; Clinic ends at 9 AM.  Patients are seen on a first-come first-served basis, and a limited number are seen during each clinic.   [facility]  [address] Ether [place]ins [place], [place] [phone]   Eligibility Requirements You must have lived in [place], [place], or [place] counties for at least the last three months.   You cannot be eligible for state or federal sponsored [place], including [employer], [place], or [employer].   You generally cannot be eligible for healthcare insurance through your employer.    How to apply: Eligibility screenings are held every Tuesday and Wednesday afternoon from 1:00 pm until 4:00 pm. You do not need an appointment for the interview!  [facility] [address] [place], [place] [phone]   [facility] Health Department  [phone]   [facility] Health Department  [phone]   [facility] Health Department  [phone]    Behavioral Health Resources in the Community: Intensive Outpatient Programs Organization         Address  Phone  Notes  [facility] [address], [place], [place] [phone]   [facility] [address] [place], [place] [phone]   [facility] [address] [place], [place]  [phone]   [facility] [address],  [place], [place] [phone] or [phone]   Substance Abuse Resources Organization         Address  Phone  Notes  [facility] Drug [facility]  [phone]   [facility]  [phone]   [facility]  [phone]   Floydene Flock  [phone]    Residential & Outpatient Substance Abuse Program  [phone]   Psychological Services Organization         Address  Phone  Notes  [facility] Health  [phone]   [facility]  [phone]  [phone]   [facility] [address], [place] [phone] or [phone]    Mobile Crisis Teams Organization         Address  Phone  Notes  [facility], Mobile Crisis Care Unit  [phone]   [facility]  [address] [place], [place] [phone]   Doristine LocksSharon DeEsch [address] [place] [phone]    Self-Help/Support Groups Organization         Address  Phone             Notes  [facility] [place] - variety of support groups  336- [phone] Call for more information  Narcotics Anonymous (NA), [facility]aring [facility] [address], [place]  2 meetings at this location   Statisticianesidential Treatment Programs Organization         Address  Phone  Notes  [facility] [address],    [place]  [phone]   [facility]  [address], [place] [phone], [place], [place] [phone]   [facility] [address], [place] [phone] Admissions: 8am-3pm M-F  [facility] [address]    [place], [place] [phone]   [facility] [address], [place], [place] [phone]   [facility] [address]  [place], [place] [phone]   [facility] - Intensive Outpatient [address], [place], [place] [phone]   [facility] ([facility] [address]  [place], [place] [phone] or [phone]   [facility] ([facility]) [address] [place], [place] [phone] Accepts Medicaid  [facility] [address]  [place] [phone] Substance Abuse/Addiction Treatment   [facility]  Resources Organization         Address  Phone  Notes  [facility]  [phone]) [phone]   Angie FavaJulie Brannon, PhD [address] [place], [place]   [phone]) [phone] or [phone]   [facility]   [address] [place], [place] [phone](336) [phone]   [facility] [address], [place], [place] [phone]) [phone] Insurance/Medicaid/sponsorship through [facility]  [facility] [address]                                    [place], [place] [phone]) [phone] Therapy/tele-psych/case  [facility] [address]   [place], [place] [phone]) [phone]    Dr. Lolly MustacheArfeen  [phone]   [facility] [place]  [employer] [facility] 1) [address], [place] 2) [address], [place] 3)  [address], [place] [phone](336) [phone]  [phone]   [facility] Child Abuse Hotline [phone]) [phone] or [phone]) [phone] (After Hours)

## 2015-10-09 ENCOUNTER — Emergency Department (HOSPITAL_BASED_OUTPATIENT_CLINIC_OR_DEPARTMENT_OTHER)
Admission: EM | Admit: 2015-10-09 | Discharge: 2015-10-09 | Disposition: A | Discharge status: Home / Self Care | Payer: PRIVATE HEALTH INSURANCE | Attending: Emergency Medicine | Admitting: Emergency Medicine

## 2015-10-09 ENCOUNTER — Encounter (HOSPITAL_BASED_OUTPATIENT_CLINIC_OR_DEPARTMENT_OTHER): Payer: Self-pay | Admitting: *Deleted

## 2015-10-09 DIAGNOSIS — R059 Cough, unspecified: Secondary | ICD-10-CM

## 2015-10-09 DIAGNOSIS — J0100 Acute maxillary sinusitis, unspecified: Secondary | ICD-10-CM

## 2015-10-09 DIAGNOSIS — R05 Cough: Secondary | ICD-10-CM

## 2015-10-09 DIAGNOSIS — J45909 Unspecified asthma, uncomplicated: Secondary | ICD-10-CM | POA: Insufficient documentation

## 2015-10-09 DIAGNOSIS — I10 Essential (primary) hypertension: Secondary | ICD-10-CM | POA: Insufficient documentation

## 2015-10-09 DIAGNOSIS — Z7982 Long term (current) use of aspirin: Secondary | ICD-10-CM | POA: Insufficient documentation

## 2015-10-09 DIAGNOSIS — Z7951 Long term (current) use of inhaled steroids: Secondary | ICD-10-CM | POA: Insufficient documentation

## 2015-10-09 DIAGNOSIS — Z79899 Other long term (current) drug therapy: Secondary | ICD-10-CM | POA: Insufficient documentation

## 2015-10-09 MED ORDER — BENZONATATE 100 MG PO CAPS: 100.0000 mg | ORAL_CAPSULE | Freq: Three times a day (TID) | ORAL | Status: DC

## 2015-10-09 MED ORDER — FLUTICASONE PROPIONATE 50 MCG/ACT NA SUSP: 2.0000 | Freq: Every day | NASAL | Status: DC

## 2015-10-09 MED ORDER — AMOXICILLIN-POT CLAVULANATE 875-125 MG PO TABS: 1.0000 | ORAL_TABLET | Freq: Two times a day (BID) | ORAL | Status: DC

## 2015-10-09 NOTE — ED Notes (Signed)
Sore throat and stuffy nose x 2 weeks. Productive cough with yellowish green sputum.

## 2015-10-09 NOTE — ED Provider Notes (Signed)
CSN: [id number]     Arrival date & time 10/09/15  1604 History   First MD Initiated Contact with Patient 10/09/15 1614     Chief Complaint  Patient presents with  . Sore Throat   HPI   59 year old male presents today with 2 weeks of upper respiratory complaints including rhinorrhea, congestion, sinus pressure, sore throat and cough. Patient reports he's used numerous over-the-counter medications for sinus and allergy without improvement in symptoms. He reports the cough is worse at night, nonproductive, with no associated shortness of breath or chest pain. He reports the sinus pressure is bilateral with worse on the right maxillary sinus that has been slowly worsening. He reports the sore throat has remained the same over the course of infection with no difficulty swallowing or breathing. Patient denies any fevers, chills, nausea, vomiting, abdominal pain or chest pain. No close sick contacts.    Past Medical History  Diagnosis Date  . Asthma   . Hypertension    Past Surgical History  Procedure Laterality Date  . Achilles tendon repair     No family history on file. Social History  Substance Use Topics  . Smoking status: Never Smoker   . Smokeless tobacco: Never Used  . Alcohol Use: 1.2 oz/week    2 Glasses of wine per week    Review of Systems  All other systems reviewed and are negative.    Allergies  Sulfa antibiotics  Home Medications   Prior to Admission medications   Medication Sig Start Date End Date Taking? Authorizing Provider  amLODipine (NORVASC) 5 MG tablet Take 1 tablet (5 mg total) by mouth daily. 03/10/15   Geoffery Lyons, MD  amoxicillin-clavulanate (AUGMENTIN) 875-125 MG tablet Take 1 tablet by mouth every 12 (twelve) hours. 10/09/15   Eyvonne Mechanic, PA-C  Ascorbic Acid (VITAMIN C) 100 MG tablet Take 100 mg by mouth daily.    Historical Provider, MD  aspirin 81 MG tablet Take 81 mg by mouth daily.    Historical Provider, MD  benzonatate (TESSALON) 100 MG  capsule Take 1 capsule (100 mg total) by mouth every 8 (eight) hours. 10/09/15   Eyvonne Mechanic, PA-C  FERROUS SULFATE PO Take 1 tablet by mouth daily.    Historical Provider, MD  fluticasone (FLONASE) 50 MCG/ACT nasal spray Place 2 sprays into both nostrils daily. 10/09/15   Tinnie Gens Malachai Schalk, PA-C  fluticasone (FLOVENT HFA) 110 MCG/ACT inhaler Inhale 1 puff into the lungs 2 (two) times daily.    Historical Provider, MD  Omega-3 Fatty Acids (FISH OIL PO) Take 1 capsule by mouth daily.    Historical Provider, MD  valsartan (DIOVAN) 160 MG tablet Take 160 mg by mouth daily.    Historical Provider, MD   BP 129/94 mmHg  Pulse 73  Temp(Src) 98.1 F (36.7 C) (Oral)  Resp 18  Ht  (1.88 m)  Wt 96.616 kg  BMI 27.34 kg/m2  SpO2 99%   Physical Exam  Constitutional: He is oriented to person, place, and time. He appears well-developed and well-nourished.  HENT:  Head: Normocephalic and atraumatic.  Right Ear: Hearing, tympanic membrane, external ear and ear canal normal.  Left Ear: Hearing, tympanic membrane, external ear and ear canal normal.  Nose: Rhinorrhea present. Right sinus exhibits maxillary sinus tenderness. Left sinus exhibits maxillary sinus tenderness.  Mouth/Throat: Uvula is midline, oropharynx is clear and moist and mucous membranes are normal. No oropharyngeal exudate, posterior oropharyngeal edema, posterior oropharyngeal erythema or tonsillar abscesses.  Eyes: Conjunctivae are normal. Pupils  are equal, round, and reactive to light. Right eye exhibits no discharge. Left eye exhibits no discharge. No scleral icterus.  Neck: Normal range of motion. No JVD present. No tracheal deviation present.  Pulmonary/Chest: Effort normal. No stridor.  Neurological: He is alert and oriented to person, place, and time. Coordination normal.  Psychiatric: He has a normal mood and affect. His behavior is normal. Judgment and thought content normal.  Nursing note and vitals reviewed.     ED  Course  Procedures (including critical care time) Labs Review Labs Reviewed - No data to display  Imaging Review No results found. I have personally reviewed and evaluated these images and lab results as part of my medical decision-making.   EKG Interpretation None      MDM   Final diagnoses:  Acute maxillary sinusitis, recurrence not specified  Cough    Labs:  Imaging:  Consults:  Therapeutics:  Discharge Meds: , Tessalon, Flonase  Assessment/Plan: Patient presentation most consistent with sinusitis. Patient sore throat likely due to post nasal drainage, no significant swelling, exudate, enlarged tonsils. Uvula is midline, no concern for postpharyngeal or peritonsillar abscess. Duration of symptoms approximately 2 weeks, this could most likely be bacterial in nature. Patient will be given a prescription for Augmentin, Flonase, and cough medication. He is instructed to use medications as directed, follow up with his primary care provider in 3-5 days for reevaluation. Strict return precautions given, verbalized understanding and agreement for today's plan.         Eyvonne MechanicJeffrey Reah Justo, PA-C 10/09/15 1715  Pricilla LovelessScott Goldston, MD 10/12/15 [phone]

## 2015-10-09 NOTE — Discharge Instructions (Signed)
Please read attached information. If you experience any new or worsening signs or symptoms please return to the emergency room for evaluation. Please follow-up with your primary care provider or specialist as discussed. Please use medication prescribed only as directed and discontinue taking if you have any concerning signs or symptoms.   °

## 2016-07-02 ENCOUNTER — Emergency Department (HOSPITAL_BASED_OUTPATIENT_CLINIC_OR_DEPARTMENT_OTHER)
Admission: EM | Admit: 2016-07-02 | Discharge: 2016-07-02 | Disposition: A | Discharge status: Home / Self Care | Payer: 59 | Attending: Emergency Medicine | Admitting: Emergency Medicine

## 2016-07-02 ENCOUNTER — Emergency Department (HOSPITAL_BASED_OUTPATIENT_CLINIC_OR_DEPARTMENT_OTHER): Payer: 59

## 2016-07-02 ENCOUNTER — Encounter (HOSPITAL_BASED_OUTPATIENT_CLINIC_OR_DEPARTMENT_OTHER): Payer: Self-pay

## 2016-07-02 DIAGNOSIS — R51 Headache: Secondary | ICD-10-CM | POA: Diagnosis not present

## 2016-07-02 DIAGNOSIS — J45909 Unspecified asthma, uncomplicated: Secondary | ICD-10-CM | POA: Diagnosis not present

## 2016-07-02 DIAGNOSIS — M549 Dorsalgia, unspecified: Secondary | ICD-10-CM | POA: Diagnosis not present

## 2016-07-02 DIAGNOSIS — R0602 Shortness of breath: Secondary | ICD-10-CM | POA: Insufficient documentation

## 2016-07-02 DIAGNOSIS — R509 Fever, unspecified: Secondary | ICD-10-CM | POA: Diagnosis not present

## 2016-07-02 DIAGNOSIS — Y9389 Activity, other specified: Secondary | ICD-10-CM | POA: Insufficient documentation

## 2016-07-02 DIAGNOSIS — R079 Chest pain, unspecified: Secondary | ICD-10-CM | POA: Insufficient documentation

## 2016-07-02 DIAGNOSIS — J3489 Other specified disorders of nose and nasal sinuses: Secondary | ICD-10-CM | POA: Insufficient documentation

## 2016-07-02 DIAGNOSIS — I10 Essential (primary) hypertension: Secondary | ICD-10-CM | POA: Diagnosis not present

## 2016-07-02 DIAGNOSIS — Z79899 Other long term (current) drug therapy: Secondary | ICD-10-CM | POA: Insufficient documentation

## 2016-07-02 DIAGNOSIS — Y92410 Unspecified street and highway as the place of occurrence of the external cause: Secondary | ICD-10-CM | POA: Diagnosis not present

## 2016-07-02 DIAGNOSIS — Y999 Unspecified external cause status: Secondary | ICD-10-CM | POA: Insufficient documentation

## 2016-07-02 DIAGNOSIS — S161XXA Strain of muscle, fascia and tendon at neck level, initial encounter: Secondary | ICD-10-CM | POA: Insufficient documentation

## 2016-07-02 DIAGNOSIS — S199XXA Unspecified injury of neck, initial encounter: Secondary | ICD-10-CM | POA: Diagnosis present

## 2016-07-02 DIAGNOSIS — R05 Cough: Secondary | ICD-10-CM | POA: Insufficient documentation

## 2016-07-02 MED ORDER — HYDROCODONE-ACETAMINOPHEN 5-325 MG PO TABS: 1.0000 | ORAL_TABLET | Freq: Four times a day (QID) | ORAL | 0 refills | Status: DC | PRN
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
## 2016-08-17 ENCOUNTER — Emergency Department (HOSPITAL_BASED_OUTPATIENT_CLINIC_OR_DEPARTMENT_OTHER): Payer: 59

## 2016-08-17 ENCOUNTER — Emergency Department (HOSPITAL_BASED_OUTPATIENT_CLINIC_OR_DEPARTMENT_OTHER)
Admission: EM | Admit: 2016-08-17 | Discharge: 2016-08-17 | Disposition: A | Discharge status: Home / Self Care | Payer: 59 | Attending: Emergency Medicine | Admitting: Emergency Medicine

## 2016-08-17 ENCOUNTER — Encounter (HOSPITAL_BASED_OUTPATIENT_CLINIC_OR_DEPARTMENT_OTHER): Payer: Self-pay | Admitting: Emergency Medicine

## 2016-08-17 DIAGNOSIS — J441 Chronic obstructive pulmonary disease with (acute) exacerbation: Secondary | ICD-10-CM | POA: Diagnosis not present

## 2016-08-17 DIAGNOSIS — R0602 Shortness of breath: Secondary | ICD-10-CM | POA: Diagnosis present

## 2016-08-17 DIAGNOSIS — Z79899 Other long term (current) drug therapy: Secondary | ICD-10-CM | POA: Insufficient documentation

## 2016-08-17 DIAGNOSIS — Z7982 Long term (current) use of aspirin: Secondary | ICD-10-CM | POA: Insufficient documentation

## 2016-08-17 DIAGNOSIS — I10 Essential (primary) hypertension: Secondary | ICD-10-CM | POA: Insufficient documentation

## 2016-08-17 MED ORDER — ALBUTEROL SULFATE HFA 108 (90 BASE) MCG/ACT IN AERS
4.0000 | INHALATION_SPRAY | Freq: Once | RESPIRATORY_TRACT | Status: AC
  Administered 2016-08-17: 4 via RESPIRATORY_TRACT
  Filled 2016-08-17: qty 6.7

## 2016-08-17 MED ORDER — DOXYCYCLINE HYCLATE 100 MG PO CAPS: 100.0000 mg | ORAL_CAPSULE | Freq: Two times a day (BID) | ORAL | 0 refills | Status: AC

## 2016-08-17 MED ORDER — ALBUTEROL SULFATE (2.5 MG/3ML) 0.083% IN NEBU
INHALATION_SOLUTION | RESPIRATORY_TRACT | Status: AC
  Administered 2016-08-17: 13:00:00
  Filled 2016-08-17: qty 3

## 2016-08-17 MED ORDER — IPRATROPIUM-ALBUTEROL 0.5-2.5 (3) MG/3ML IN SOLN
RESPIRATORY_TRACT | Status: AC
  Administered 2016-08-17: 13:00:00
  Filled 2016-08-17: qty 3

## 2016-08-17 MED ORDER — PREDNISONE 50 MG PO TABS
60.0000 mg | ORAL_TABLET | Freq: Once | ORAL | Status: AC
  Administered 2016-08-17: 60 mg via ORAL
  Filled 2016-08-17: qty 1

## 2016-08-17 MED ORDER — ALBUTEROL SULFATE (2.5 MG/3ML) 0.083% IN NEBU: 2.5000 mg | INHALATION_SOLUTION | Freq: Four times a day (QID) | RESPIRATORY_TRACT | 0 refills | Status: AC | PRN

## 2016-08-17 MED ORDER — PREDNISONE 20 MG PO TABS: 60.0000 mg | ORAL_TABLET | Freq: Every day | ORAL | 0 refills | Status: AC

## 2016-08-17 MED ORDER — AEROCHAMBER PLUS FLO-VU MEDIUM MISC
1.0000 | Freq: Once | Status: DC
Start: 2016-08-17 — End: 2016-08-17
  Filled 2016-08-17: qty 1

## 2016-08-17 NOTE — ED Notes (Signed)
RT assessed patient in triage due to SOB complaint. No distresses noted at this time. SAT 97% on RA. Patient stated he had an albuterol tx around 3-4 hours ago. BBS slight wheezes but good air movement.

## 2016-08-17 NOTE — ED Provider Notes (Signed)
[facility]   CSN: [id number] Arrival date & time: 08/17/16  1142     History   Chief Complaint Chief Complaint  Patient presents with  . Shortness of Breath    HPI Leonard Osborn is a 60 y.o. male.  HPI 60 year old male with past medical history of mild intermittent asthma who presents with a one-week history of shortness of breath. The patient states that approximately one month ago, he had a mild asthma exacerbation in the setting of weather changes. He completed a course of prednisone and had resolution of his symptoms. The last week, due to weather changes, he has had recurrence of shortness of breath. He endorses persistently worsening, mild shortness of breath with a dry, hacking cough. His symptoms feel similar to his asthma exacerbations in the past. Denies any chest pain. Denies any lower extremity swelling. No history of DVTs. He has been using his inhaler with intermittent resolution of symptoms but has had to use it increasingly frequent. Of note, while in the waiting room, patient had a nebulizer treatment with complete resolution of his symptoms.  Past Medical History:  Diagnosis Date  . Asthma   . Hypertension     There are no active problems to display for this patient.   Past Surgical History:  Procedure Laterality Date  . ACHILLES TENDON REPAIR         Home Medications    Prior to Admission medications   Medication Sig Start Date End Date Taking? Authorizing Provider  amLODipine (NORVASC) 5 MG tablet Take 1 tablet (5 mg total) by mouth daily. 03/10/15  Yes Geoffery Lyonsouglas Delo, MD  aspirin 81 MG tablet Take 81 mg by mouth daily.   Yes Historical Provider, MD  LISINOPRIL PO Take by mouth.   Yes Historical Provider, MD  albuterol (PROVENTIL) (2.5 MG/3ML) 0.083% nebulizer solution Take 3 mLs (2.5 mg total) by nebulization every 6 (six) hours as needed for wheezing or shortness of breath. 08/17/16   Shaune Pollackameron Nakeia Calvi, MD  Ascorbic Acid  (VITAMIN C) 100 MG tablet Take 100 mg by mouth daily.    Historical Provider, MD  CLARITHROMYCIN PO Take by mouth.    Historical Provider, MD  doxycycline (VIBRAMYCIN) 100 MG capsule Take 1 capsule (100 mg total) by mouth 2 (two) times daily. 08/17/16 08/24/16  Shaune Pollackameron Jadarrius Maselli, MD  fluticasone (FLOVENT HFA) 110 MCG/ACT inhaler Inhale 1 puff into the lungs 2 (two) times daily.    Historical Provider, MD  HYDROcodone-acetaminophen (NORCO/VICODIN) 5-325 MG tablet Take 1-2 tablets by mouth every 6 (six) hours as needed for moderate pain. 07/02/16   Vanetta MuldersScott Zackowski, MD  predniSONE (DELTASONE) 20 MG tablet Take 3 tablets (60 mg total) by mouth daily. 08/17/16 08/22/16  Shaune Pollackameron Wirt Hemmerich, MD    Family History No family history on file.  Social History Social History  Substance Use Topics  . Smoking status: Never Smoker  . Smokeless tobacco: Never Used  . Alcohol use Yes     Comment: occ     Allergies   Sulfa antibiotics   Review of Systems Review of Systems  Constitutional: Negative for chills, fatigue and fever.  HENT: Negative for congestion and rhinorrhea.   Eyes: Negative for visual disturbance.  Respiratory: Positive for cough, shortness of breath and wheezing.   Cardiovascular: Negative for chest pain and leg swelling.  Gastrointestinal: Negative for abdominal pain, diarrhea, nausea and vomiting.  Genitourinary: Negative for dysuria and flank pain.  Musculoskeletal: Negative for neck pain and neck stiffness.  Skin: Negative for rash and wound.  Allergic/Immunologic: Negative for immunocompromised state.  Neurological: Negative for syncope, weakness and headaches.  All other systems reviewed and are negative.    Physical Exam Updated Vital Signs BP 142/92 (BP Location: Right Arm)   Pulse 70   Temp 98.1 F (36.7 C) (Oral)   Resp 18   Ht 6\' 2"  (1.88 m)   Wt 210 lb (95.3 kg)   SpO2 99%   BMI 26.96 kg/m   Physical Exam  Constitutional: He is oriented to person, place, and  time. He appears well-developed and well-nourished. No distress.  HENT:  Head: Normocephalic and atraumatic.  Eyes: Conjunctivae are normal.  Neck: Neck supple.  Cardiovascular: Normal rate, regular rhythm and normal heart sounds.  Exam reveals no friction rub.   No murmur heard. Pulmonary/Chest: Effort normal. No respiratory distress. He has wheezes (Mild, end expiratory only). He has no rales.  Abdominal: He exhibits no distension.  Musculoskeletal: He exhibits no edema.  Neurological: He is alert and oriented to person, place, and time. He exhibits normal muscle tone.  Skin: Skin is warm. Capillary refill takes less than 2 seconds.  Psychiatric: He has a normal mood and affect.  Nursing note and vitals reviewed.    ED Treatments / Results  Labs (all labs ordered are listed, but only abnormal results are displayed) Labs Reviewed - No data to display  EKG  EKG Interpretation  Date/Time:  Sunday August 17 2016 11:59:56 EDT Ventricular Rate:  59 PR Interval:  196 QRS Duration: 84 QT Interval:  422 QTC Calculation: 417 R Axis:   65 Text Interpretation:  Sinus bradycardia Otherwise normal ECG When compared to prior, No significant change was found No STEMI Confirmed by Rush LandmarkEGELER MD, CHRISTOPHER [phone]([id number]) on 08/17/2016 12:11:37 PM       Radiology Dg Chest 2 View  Result Date: 08/17/2016 CLINICAL DATA:  Short of breath EXAM: CHEST  2 VIEW COMPARISON:  04/23/2006 FINDINGS: The heart size and mediastinal contours are within normal limits. Both lungs are clear. The visualized skeletal structures are unremarkable. IMPRESSION: No active cardiopulmonary disease. Electronically Signed   By: Marlan Palauharles  Clark M.D.   On: 08/17/2016 12:50    Procedures Procedures (including critical care time)  Medications Ordered in ED Medications  predniSONE (DELTASONE) tablet 60 mg (not administered)  albuterol (PROVENTIL HFA;VENTOLIN HFA) 108 (90 Base) MCG/ACT inhaler 4 puff (not administered)    AEROCHAMBER PLUS FLO-VU MEDIUM MISC 1 each (not administered)  albuterol (PROVENTIL) (2.5 MG/3ML) 0.083% nebulizer solution (  Given 08/17/16 1246)  ipratropium-albuterol (DUONEB) 0.5-2.5 (3) MG/3ML nebulizer solution (  Given 08/17/16 1246)     Initial Impression / Assessment and Plan / ED Course  I have reviewed the triage vital signs and the nursing notes.  Pertinent labs & imaging results that were available during my care of the patient were reviewed by me and considered in my medical decision making (see chart for details).  Clinical Course    60 year old male with past mental history of mild asthma/COPD here with mild coughing, nonproductive, with shortness of breath. Chest x-ray shows no pneumonia. EKG is nonischemic. Symptoms are similar to his previous asthma/COPD exacerbations. Symptoms are now resolved after DuoNeb in the waiting room and he has only minimal wheezing on my exam. Normal work of breathing and speaking in full sentences. No fever or signs of pneumonia. Suspect mild asthma versus COPD exacerbation. No evidence of significant respiratory distress. Will trial albuterol with spacer, given outpatient burst of  steroids as well as doxycycline. Return precautions were given. As mentioned, EKG is nonischemic and patient denies any chest pain and I do not suspect ACS. He has no hypoxia, tachycardia, lower extremity swelling, or signs of DVT or PE.  Final Clinical Impressions(s) / ED Diagnoses   Final diagnoses:  COPD exacerbation (HCC)    New Prescriptions New Prescriptions   ALBUTEROL (PROVENTIL) (2.5 MG/3ML) 0.083% NEBULIZER SOLUTION    Take 3 mLs (2.5 mg total) by nebulization every 6 (six) hours as needed for wheezing or shortness of breath.   DOXYCYCLINE (VIBRAMYCIN) 100 MG CAPSULE    Take 1 capsule (100 mg total) by mouth 2 (two) times daily.   PREDNISONE (DELTASONE) 20 MG TABLET    Take 3 tablets (60 mg total) by mouth daily.     Shaune Pollack, MD 08/18/16  [phone]

## 2016-08-17 NOTE — ED Triage Notes (Signed)
SOB x 4 days with productive cough. Been using inhaler at home.

## 2016-08-17 NOTE — ED Notes (Signed)
Brought patient back to fast track to do neb treatment. BBS still slight wheezes. No distress noted. Will send back out to waiting room after treatment complete

## 2016-09-11 ENCOUNTER — Encounter (HOSPITAL_BASED_OUTPATIENT_CLINIC_OR_DEPARTMENT_OTHER): Payer: Self-pay | Admitting: *Deleted

## 2016-09-11 ENCOUNTER — Emergency Department (HOSPITAL_BASED_OUTPATIENT_CLINIC_OR_DEPARTMENT_OTHER)
Admission: EM | Admit: 2016-09-11 | Discharge: 2016-09-11 | Disposition: A | Discharge status: Home / Self Care | Payer: 59 | Attending: Emergency Medicine | Admitting: Emergency Medicine

## 2016-09-11 DIAGNOSIS — Z79899 Other long term (current) drug therapy: Secondary | ICD-10-CM | POA: Insufficient documentation

## 2016-09-11 DIAGNOSIS — J45909 Unspecified asthma, uncomplicated: Secondary | ICD-10-CM | POA: Diagnosis not present

## 2016-09-11 DIAGNOSIS — Z7982 Long term (current) use of aspirin: Secondary | ICD-10-CM | POA: Insufficient documentation

## 2016-09-11 DIAGNOSIS — I10 Essential (primary) hypertension: Secondary | ICD-10-CM | POA: Diagnosis not present

## 2016-09-11 DIAGNOSIS — R0981 Nasal congestion: Secondary | ICD-10-CM | POA: Diagnosis present

## 2016-09-11 DIAGNOSIS — J069 Acute upper respiratory infection, unspecified: Secondary | ICD-10-CM | POA: Insufficient documentation

## 2016-09-11 MED ORDER — CLARITHROMYCIN 500 MG PO TABS: 500.0000 mg | ORAL_TABLET | Freq: Two times a day (BID) | ORAL | 0 refills | Status: AC

## 2016-09-11 MED ORDER — FLUTICASONE PROPIONATE 50 MCG/ACT NA SUSP: 2.0000 | Freq: Every day | NASAL | 0 refills | Status: AC

## 2016-09-11 MED ORDER — OXYMETAZOLINE HCL 0.05 % NA SOLN
1.0000 | Freq: Once | NASAL | Status: AC
  Administered 2016-09-11: 1 via NASAL
  Filled 2016-09-11: qty 15

## 2016-09-11 NOTE — ED Provider Notes (Signed)
[facility] Provider Note   CSN: [id number] Arrival date & time: 09/11/16  0520     History   Chief Complaint Chief Complaint  Patient presents with  . URI    HPI Leonard Osborn is a 60 y.o. male.  The history is provided by the patient.  URI   This is a new problem. The current episode started more than 2 days ago. The problem has not changed since onset.There has been no fever. Associated symptoms include congestion and sinus pain. Pertinent negatives include no chest pain, no plugged ear sensation, no swollen glands and no wheezing. He has tried NSAIDs for the symptoms. The treatment provided no relief.    Past Medical History:  Diagnosis Date  . Asthma   . Hypertension     There are no active problems to display for this patient.   Past Surgical History:  Procedure Laterality Date  . ACHILLES TENDON REPAIR         Home Medications    Prior to Admission medications   Medication Sig Start Date End Date Taking? Authorizing Provider  albuterol (PROVENTIL) (2.5 MG/3ML) 0.083% nebulizer solution Take 3 mLs (2.5 mg total) by nebulization every 6 (six) hours as needed for wheezing or shortness of breath. 08/17/16   Shaune Pollackameron Isaacs, MD  amLODipine (NORVASC) 5 MG tablet Take 1 tablet (5 mg total) by mouth daily. 03/10/15   Geoffery Lyonsouglas Delo, MD  Ascorbic Acid (VITAMIN C) 100 MG tablet Take 100 mg by mouth daily.    Historical Provider, MD  aspirin 81 MG tablet Take 81 mg by mouth daily.    Historical Provider, MD  clarithromycin (BIAXIN) 500 MG tablet Take 1 tablet (500 mg total) by mouth 2 (two) times daily. 09/11/16   Tamiki Kuba, MD  fluticasone [facility](FLONASE) 50 MCG/ACT nasal spray Place 2 sprays into both nostrils daily. Continue even after you feel better 09/11/16   Ingris Pasquarella, MD  fluticasone (FLOVENT HFA) 110 MCG/ACT inhaler Inhale 1 puff into the lungs 2 (two) times daily.    Historical Provider, MD  HYDROcodone-acetaminophen (NORCO/VICODIN) 5-325 MG tablet  Take 1-2 tablets by mouth every 6 (six) hours as needed for moderate pain. 07/02/16   Vanetta MuldersScott Zackowski, MD  LISINOPRIL PO Take by mouth.    Historical Provider, MD    Family History History reviewed. No pertinent family history.  Social History Social History  Substance Use Topics  . Smoking status: Never Smoker  . Smokeless tobacco: Never Used  . Alcohol use Yes     Comment: occ     Allergies   Sulfa antibiotics   Review of Systems Review of Systems  HENT: Positive for congestion and sinus pain.   Respiratory: Negative for wheezing.   Cardiovascular: Negative for chest pain.  All other systems reviewed and are negative.    Physical Exam Updated Vital Signs BP 133/85 (BP Location: Right Arm)   Pulse 70   Temp 98.2 F (36.8 C) (Oral)   Resp 20   Ht 6\' 2"  (1.88 m)   Wt 210 lb (95.3 kg)   SpO2 97%   BMI 26.96 kg/m   Physical Exam  Constitutional: He is oriented to person, place, and time. He appears well-developed and well-nourished.  HENT:  Head: Normocephalic and atraumatic.  Nose: Nose normal.  Mouth/Throat: No oropharyngeal exudate.  Eyes: Conjunctivae and EOM are normal. Pupils are equal, round, and reactive to light.  Neck: Normal range of motion. Neck supple. No JVD present.  Cardiovascular: Normal rate,  regular rhythm, normal heart sounds and intact distal pulses.   Pulmonary/Chest: Effort normal and breath sounds normal. No stridor. No respiratory distress. He has no wheezes.  Abdominal: Soft. Bowel sounds are normal. He exhibits no mass. There is no tenderness. There is no rebound and no guarding.  Musculoskeletal: Normal range of motion.  Lymphadenopathy:    He has no cervical adenopathy.  Neurological: He is alert and oriented to person, place, and time.  Skin: Skin is warm and dry. Capillary refill takes less than 2 seconds.  Psychiatric: He has a normal mood and affect.     ED Treatments / Results   Vitals:   09/11/16 0526  BP: 133/85    Pulse: 70  Resp: 20  Temp: 98.2 F (36.8 C)    Procedures Procedures (including critical care time)  Medications Ordered in ED Medications  oxymetazoline (AFRIN) 0.05 % nasal spray 1 spray (not administered)    Final Clinical Impressions(s) / ED Diagnoses   Final diagnoses:  Nasal congestion  Upper respiratory tract infection, unspecified type    New Prescriptions New Prescriptions   CLARITHROMYCIN (BIAXIN) 500 MG TABLET    Take 1 tablet (500 mg total) by mouth 2 (two) times daily.   FLUTICASONE (FLONASE) 50 MCG/ACT NASAL SPRAY    Place 2 sprays into both nostrils daily. Continue even after you feel better   All questions answered to patient's satisfaction. Based on history and exam patient has been appropriately medically screened and emergency conditions excluded. Patient is stable for discharge at this time. Follow up with your PMD for recheck in 2 days and strict return precautions given.   I personally performed the services described in this documentation, which was scribed in my presence. The recorded information has been reviewed and is accurate.     Cy BlamerApril Xandria Gallaga, MD 09/11/16 [phone]

## 2016-09-11 NOTE — ED Triage Notes (Signed)
Sneezing, URI x 1 week. Denies cough.

## 2016-10-25 ENCOUNTER — Encounter (HOSPITAL_BASED_OUTPATIENT_CLINIC_OR_DEPARTMENT_OTHER): Payer: Self-pay | Admitting: Emergency Medicine

## 2016-10-25 ENCOUNTER — Emergency Department (HOSPITAL_BASED_OUTPATIENT_CLINIC_OR_DEPARTMENT_OTHER)
Admission: EM | Admit: 2016-10-25 | Discharge: 2016-10-25 | Disposition: A | Discharge status: Home / Self Care | Payer: 59 | Attending: Emergency Medicine | Admitting: Emergency Medicine

## 2016-10-25 DIAGNOSIS — Z7982 Long term (current) use of aspirin: Secondary | ICD-10-CM | POA: Insufficient documentation

## 2016-10-25 DIAGNOSIS — I10 Essential (primary) hypertension: Secondary | ICD-10-CM | POA: Insufficient documentation

## 2016-10-25 DIAGNOSIS — Z79899 Other long term (current) drug therapy: Secondary | ICD-10-CM | POA: Diagnosis not present

## 2016-10-25 DIAGNOSIS — J9801 Acute bronchospasm: Secondary | ICD-10-CM | POA: Diagnosis not present

## 2016-10-25 DIAGNOSIS — R062 Wheezing: Secondary | ICD-10-CM | POA: Diagnosis present

## 2016-10-25 MED ORDER — IPRATROPIUM-ALBUTEROL 0.5-2.5 (3) MG/3ML IN SOLN
3.0000 mL | Freq: Once | RESPIRATORY_TRACT | Status: AC
  Administered 2016-10-25: 3 mL via RESPIRATORY_TRACT
  Filled 2016-10-25: qty 3

## 2016-10-25 MED ORDER — DEXAMETHASONE SODIUM PHOSPHATE 10 MG/ML IJ SOLN
10.0000 mg | Freq: Once | INTRAMUSCULAR | Status: AC
  Administered 2016-10-25: 10 mg via INTRAMUSCULAR

## 2016-10-25 MED ORDER — DEXAMETHASONE SODIUM PHOSPHATE 10 MG/ML IJ SOLN
INTRAMUSCULAR | Status: AC
  Filled 2016-10-25: qty 1

## 2016-10-25 MED ORDER — ALBUTEROL SULFATE (2.5 MG/3ML) 0.083% IN NEBU
2.5000 mg | INHALATION_SOLUTION | Freq: Once | RESPIRATORY_TRACT | Status: AC
  Administered 2016-10-25: 2.5 mg via RESPIRATORY_TRACT
  Filled 2016-10-25: qty 3

## 2016-10-25 NOTE — ED Provider Notes (Signed)
[facility] Provider Note: Lowella Dell, MD, FACEP  CSN: [id number] MRN: [id number] ARRIVAL: 10/25/16 at 0547 ROOM: [facility]06/[facility]06   CHIEF COMPLAINT  Shortness of Breath   HISTORY OF PRESENT ILLNESS  Leonard Osborn is a 61 y.o. male with a history of asthma. He is here with an asthma exacerbation for the past 2 days. He has had a nonproductive cough and wheezing. He has not had a fever. He has been using his home inhalers but this morning he did not get adequate relief so he came to the emergency department. He was evaluated on arrival by respiratory therapy found him to be diminished with wheezes. He was given an albuterol and ipratropium neb treatment with significant improvement in his symptoms. He is now moving air well and feels back to baseline.    Past Medical History:  Diagnosis Date  . Asthma   . Hypertension     Past Surgical History:  Procedure Laterality Date  . ACHILLES TENDON REPAIR      No family history on file.  Social History  Substance Use Topics  . Smoking status: Never Smoker  . Smokeless tobacco: Never Used  . Alcohol use Yes     Comment: occ    Prior to Admission medications   Medication Sig Start Date End Date Taking? Authorizing Provider  albuterol (PROVENTIL) (2.5 MG/3ML) 0.083% nebulizer solution Take 3 mLs (2.5 mg total) by nebulization every 6 (six) hours as needed for wheezing or shortness of breath. 08/17/16   Shaune Pollack, MD  amLODipine (NORVASC) 5 MG tablet Take 1 tablet (5 mg total) by mouth daily. 03/10/15   Geoffery Lyons, MD  Ascorbic Acid (VITAMIN C) 100 MG tablet Take 100 mg by mouth daily.    Historical Provider, MD  aspirin 81 MG tablet Take 81 mg by mouth daily.    Historical Provider, MD  clarithromycin (BIAXIN) 500 MG tablet Take 1 tablet (500 mg total) by mouth 2 (two) times daily. 09/11/16   April Palumbo, MD  fluticasone [facility]) 50 MCG/ACT nasal spray Place 2 sprays into both nostrils daily. Continue even after you  feel better 09/11/16   April Palumbo, MD  fluticasone (FLOVENT HFA) 110 MCG/ACT inhaler Inhale 1 puff into the lungs 2 (two) times daily.    Historical Provider, MD  HYDROcodone-acetaminophen (NORCO/VICODIN) 5-325 MG tablet Take 1-2 tablets by mouth every 6 (six) hours as needed for moderate pain. 07/02/16   Vanetta Mulders, MD  LISINOPRIL PO Take by mouth.    Historical Provider, MD    Allergies Sulfa antibiotics   REVIEW OF SYSTEMS  Negative except as noted here or in the History of Present Illness.   PHYSICAL EXAMINATION  Initial Vital Signs Blood pressure 140/90, pulse 81, temperature 98.3 F (36.8 C), temperature source Oral, resp. rate 16, height 6\' 2"  (1.88 m), weight 210 lb (95.3 kg), SpO2 96 %.  Examination General: Well-developed, well-nourished male in no acute distress; appearance consistent with age of record HENT: normocephalic; atraumatic Eyes: pupils equal, round and reactive to light; extraocular muscles intact Neck: supple Heart: regular rate and rhythm Lungs: clear to auscultation bilaterally Abdomen: soft; nondistended; nontender; bowel sounds present Extremities: No deformity; full range of motion; pulses normal Neurologic: Awake, alert and oriented; motor function intact in all extremities and symmetric; no facial droop Skin: Warm and dry Psychiatric: Normal mood and affect   RESULTS  Summary of this visit's results, reviewed by myself:   EKG Interpretation  Date/Time:    Ventricular Rate:  PR Interval:    QRS Duration:   QT Interval:    QTC Calculation:   R Axis:     Text Interpretation:        Laboratory Studies: No results found for this or any previous visit (from the past 24 hour(s)). Imaging Studies: No results found.  ED COURSE  Nursing notes and initial vitals signs, including pulse oximetry, reviewed.  Vitals:   10/25/16 0549 10/25/16 0551  BP: 140/90   Pulse: 81   Resp: 16   Temp: 98.3 F (36.8 C)   TempSrc: Oral     SpO2: 96%   Weight:  210 lb (95.3 kg)  Height:  6\' 2"  (1.88 m)   The patient will follow-up with his primary care physician, Dr. Mardelle MatteAndy.  PROCEDURES    ED DIAGNOSES     ICD-9-CM ICD-10-CM   1. Acute bronchospasm 519.11 J98.01        Paula LibraJohn Aimar Borghi, MD 10/25/16 [phone]

## 2016-10-25 NOTE — ED Triage Notes (Signed)
Pt reports asthma exacerbation. Pt states he has a non-productive cough and wheezing.

## 2016-10-27 ENCOUNTER — Emergency Department (HOSPITAL_BASED_OUTPATIENT_CLINIC_OR_DEPARTMENT_OTHER)
Admission: EM | Admit: 2016-10-27 | Discharge: 2016-10-27 | Disposition: A | Discharge status: Home / Self Care | Payer: 59 | Attending: Emergency Medicine | Admitting: Emergency Medicine

## 2016-10-27 ENCOUNTER — Encounter (HOSPITAL_BASED_OUTPATIENT_CLINIC_OR_DEPARTMENT_OTHER): Payer: Self-pay | Admitting: *Deleted

## 2016-10-27 DIAGNOSIS — J45909 Unspecified asthma, uncomplicated: Secondary | ICD-10-CM | POA: Diagnosis not present

## 2016-10-27 DIAGNOSIS — Z79899 Other long term (current) drug therapy: Secondary | ICD-10-CM | POA: Insufficient documentation

## 2016-10-27 DIAGNOSIS — R05 Cough: Secondary | ICD-10-CM | POA: Diagnosis present

## 2016-10-27 DIAGNOSIS — Z7982 Long term (current) use of aspirin: Secondary | ICD-10-CM | POA: Insufficient documentation

## 2016-10-27 DIAGNOSIS — I10 Essential (primary) hypertension: Secondary | ICD-10-CM | POA: Insufficient documentation

## 2016-10-27 DIAGNOSIS — R059 Cough, unspecified: Secondary | ICD-10-CM

## 2016-10-27 MED ORDER — DEXAMETHASONE SODIUM PHOSPHATE 10 MG/ML IJ SOLN
10.0000 mg | Freq: Once | INTRAMUSCULAR | Status: AC
  Administered 2016-10-27: 10 mg via INTRAMUSCULAR
  Filled 2016-10-27: qty 1

## 2016-10-27 MED ORDER — HYDROCOD POLST-CPM POLST ER 10-8 MG/5ML PO SUER: 5.0000 mL | Freq: Two times a day (BID) | ORAL | 0 refills | Status: AC | PRN

## 2016-10-27 NOTE — ED Triage Notes (Signed)
Pt reports cough that makes his throat hurt.  Denies pain in throat when he is not coughing.  No distress noted.  No wheezing noted.

## 2016-10-27 NOTE — ED Provider Notes (Signed)
[facility] Provider Note: Lowella Dell, MD, FACEP  CSN: [id number] MRN: [id number] ARRIVAL: 10/27/16 at 0020 ROOM: [facility]11/[facility]11   CHIEF COMPLAINT  Cough   HISTORY OF PRESENT ILLNESS  Leonard Osborn is a 61 y.o. male who was seen by myself yesterday for an asthma exacerbation. He was given a neb treatment with significant improvement. He was also given a shot of dexamethasone. He states his breathing has remained well since yesterday but he is here with a worsening cough. The cough is paroxysmal and is causing him to have a sore throat and dysphonia. He is not aware of having a fever. The cough is principally nonproductive.   Past Medical History:  Diagnosis Date  . Asthma   . Hypertension     Past Surgical History:  Procedure Laterality Date  . ACHILLES TENDON REPAIR      History reviewed. No pertinent family history.  Social History  Substance Use Topics  . Smoking status: Never Smoker  . Smokeless tobacco: Never Used  . Alcohol use Yes     Comment: occ    Prior to Admission medications   Medication Sig Start Date End Date Taking? Authorizing Provider  albuterol (PROVENTIL) (2.5 MG/3ML) 0.083% nebulizer solution Take 3 mLs (2.5 mg total) by nebulization every 6 (six) hours as needed for wheezing or shortness of breath. 08/17/16   Shaune Pollack, MD  amLODipine (NORVASC) 5 MG tablet Take 1 tablet (5 mg total) by mouth daily. 03/10/15   Geoffery Lyons, MD  Ascorbic Acid (VITAMIN C) 100 MG tablet Take 100 mg by mouth daily.    Historical Provider, MD  aspirin 81 MG tablet Take 81 mg by mouth daily.    Historical Provider, MD  clarithromycin (BIAXIN) 500 MG tablet Take 1 tablet (500 mg total) by mouth 2 (two) times daily. 09/11/16   April Palumbo, MD  fluticasone [facility]) 50 MCG/ACT nasal spray Place 2 sprays into both nostrils daily. Continue even after you feel better 09/11/16   April Palumbo, MD  fluticasone (FLOVENT HFA) 110 MCG/ACT inhaler Inhale 1 puff into  the lungs 2 (two) times daily.    Historical Provider, MD  LISINOPRIL PO Take by mouth.    Historical Provider, MD    Allergies Sulfa antibiotics   REVIEW OF SYSTEMS  Negative except as noted here or in the History of Present Illness.   PHYSICAL EXAMINATION  Initial Vital Signs Blood pressure 141/72, pulse 78, temperature 98.4 F (36.9 C), temperature source Oral, resp. rate 20, height 6\' 2"  (1.88 m), weight 210 lb (95.3 kg), SpO2 97 %.  Examination General: Well-developed, well-nourished male in no acute distress; appearance consistent with age of record HENT: normocephalic; atraumatic; pharynx normal; dysphonia Eyes: pupils equal, round and reactive to light; extraocular muscles intact Neck: supple Heart: regular rate and rhythm Lungs: clear to auscultation bilaterally Abdomen: soft; nondistended; nontender; bowel sounds present Extremities: No deformity; full range of motion; pulses normal Neurologic: Awake, alert and oriented; motor function intact in all extremities and symmetric; no facial droop Skin: Warm and dry Psychiatric: Normal mood and affect   RESULTS  Summary of this visit's results, reviewed by myself:   EKG Interpretation  Date/Time:    Ventricular Rate:    PR Interval:    QRS Duration:   QT Interval:    QTC Calculation:   R Axis:     Text Interpretation:        Laboratory Studies: No results found for this or any previous visit (from  the past 24 hour(s)). Imaging Studies: No results found.  ED COURSE  Nursing notes and initial vitals signs, including pulse oximetry, reviewed.  Vitals:   10/27/16 0033  BP: 141/72  Pulse: 78  Resp: 20  Temp: 98.4 F (36.9 C)  TempSrc: Oral  SpO2: 97%  Weight: 210 lb (95.3 kg)  Height: 6\' 2"  (1.88 m)   We'll repeat a shot of dexamethasone and place on Tussionex for cough.  PROCEDURES    ED DIAGNOSES     ICD-9-CM ICD-10-CM   1. Cough 786.2 R05       Paula LibraJohn Lorean Ekstrand, MD 10/27/16 [phone]

## 2016-10-31 ENCOUNTER — Emergency Department (HOSPITAL_BASED_OUTPATIENT_CLINIC_OR_DEPARTMENT_OTHER): Payer: 59

## 2016-10-31 ENCOUNTER — Encounter (HOSPITAL_BASED_OUTPATIENT_CLINIC_OR_DEPARTMENT_OTHER): Payer: Self-pay | Admitting: *Deleted

## 2016-10-31 ENCOUNTER — Emergency Department (HOSPITAL_BASED_OUTPATIENT_CLINIC_OR_DEPARTMENT_OTHER)
Admission: EM | Admit: 2016-10-31 | Discharge: 2016-10-31 | Disposition: A | Discharge status: Home / Self Care | Payer: 59 | Attending: Emergency Medicine | Admitting: Emergency Medicine

## 2016-10-31 DIAGNOSIS — J45909 Unspecified asthma, uncomplicated: Secondary | ICD-10-CM | POA: Diagnosis not present

## 2016-10-31 DIAGNOSIS — Z79899 Other long term (current) drug therapy: Secondary | ICD-10-CM | POA: Insufficient documentation

## 2016-10-31 DIAGNOSIS — J069 Acute upper respiratory infection, unspecified: Secondary | ICD-10-CM | POA: Diagnosis not present

## 2016-10-31 DIAGNOSIS — R05 Cough: Secondary | ICD-10-CM | POA: Insufficient documentation

## 2016-10-31 DIAGNOSIS — I10 Essential (primary) hypertension: Secondary | ICD-10-CM | POA: Diagnosis not present

## 2016-10-31 DIAGNOSIS — Z7982 Long term (current) use of aspirin: Secondary | ICD-10-CM | POA: Diagnosis not present

## 2016-10-31 DIAGNOSIS — R059 Cough, unspecified: Secondary | ICD-10-CM

## 2016-10-31 MED ORDER — BENZONATATE 100 MG PO CAPS: 100.0000 mg | ORAL_CAPSULE | Freq: Three times a day (TID) | ORAL | 0 refills | Status: AC

## 2016-10-31 MED ORDER — AZITHROMYCIN 250 MG PO TABS: 250.0000 mg | ORAL_TABLET | Freq: Every day | ORAL | 0 refills | Status: AC

## 2016-10-31 NOTE — ED Triage Notes (Signed)
Cough x 1 week. He has been here x 3 this week per pt.

## 2016-10-31 NOTE — Discharge Instructions (Signed)

## 2016-10-31 NOTE — ED Provider Notes (Signed)
Emergency Department Provider Note   I have reviewed the triage vital signs and the nursing notes.  By signing my name below, I, Valentino Saxon, attest that this documentation has been prepared under the direction and in the presence of Maia Plan, MD. Electronically Signed: Valentino Saxon, ED Scribe. 10/31/16. 9:34 PM.   HISTORY  Chief Complaint Cough   HPI Comments: Leonard Osborn is a 61 y.o. male with PMHx of asthma and HTN, who presents to the Emergency Department complaining of gradually worsening, frequent, mild cough onset a week ago. Pt notes he was last seen in ED on 01/08 for similar symptoms. Per pt, he states he was given neb treatments with minimal relief. Pt was given a shot of dexamethasone and placed on Tussinoex for cough. He notes using his albuterol at home with minimal to no relief. Pt notes his cough is exacerbated with laying down. Denies fever, chills and abdominal pain.    Past Medical History:  Diagnosis Date  . Asthma   . Hypertension     There are no active problems to display for this patient.   Past Surgical History:  Procedure Laterality Date  . ACHILLES TENDON REPAIR      Current Outpatient Rx  . Order #: [id number] Class: Print  . Order #: [id number] Class: Print  . Order #: [id number] Class: Historical Med  . Order #: [id number] Class: Historical Med  . Order #: [id number] Class: Print  . Order #: [id number] Class: Print  . Order #: [id number] Class: Print  . Order #: [id number] Class: Print  . Order #: [id number] Class: Print  . Order #: [id number] Class: Historical Med  . Order #: [id number] Class: Historical Med    Allergies Sulfa antibiotics  No family history on file.  Social History Social History  Substance Use Topics  . Smoking status: Never Smoker  . Smokeless tobacco: Never Used  . Alcohol use Yes     Comment: occ    Review of Systems  Constitutional: No fever/chills Eyes: No visual changes. ENT: No sore throat. Cardiovascular:  Denies chest pain. Respiratory: Cough. Denies shortness of breath. Gastrointestinal: No abdominal pain.  No nausea, no vomiting.  No diarrhea.  No constipation. Genitourinary: Negative for dysuria. Musculoskeletal: Negative for back pain. Skin: Negative for rash. Neurological: Negative for headaches, focal weakness or numbness.  10-point ROS otherwise negative.  ____________________________________________   PHYSICAL EXAM:  VITAL SIGNS: ED Triage Vitals  Enc Vitals Group     BP 10/31/16 2041 136/93     Pulse Rate 10/31/16 2041 68     Resp 10/31/16 2041 16     Temp 10/31/16 2041 98.1 F (36.7 C)     Temp Source 10/31/16 2041 Oral     SpO2 10/31/16 2041 99 %     Weight 10/31/16 2047 210 lb (95.3 kg)     Height 10/31/16 2047 6\' 2"  (1.88 m)     Pain Score 10/31/16 2048 2   Constitutional: Alert and oriented. Well appearing and in no acute distress. Eyes: Conjunctivae are normal.  Head: Atraumatic. Nose: No congestion/rhinnorhea. Mouth/Throat: Mucous membranes are moist.  Oropharynx non-erythematous. Neck: No stridor.   Cardiovascular: Normal rate, regular rhythm. Good peripheral circulation. Grossly normal heart sounds.   Respiratory: Normal respiratory effort.  No retractions. Lungs with faint bilateral crackles.  Musculoskeletal: No lower extremity tenderness nor edema. No gross deformities of extremities. Neurologic:  Normal speech and language. No gross focal neurologic deficits are appreciated.  Skin:  Skin is warm, dry and intact. No rash noted.  ____________________________________________  DIAGNOSTIC STUDIES: Oxygen Saturation is 99% on RA, normal by my interpretation.    COORDINATION OF CARE: 9:34 PM Discussed treatment plan with pt at bedside which includes chest imaging and pt agreed to plan.  ____________________________________________  RADIOLOGY  Dg Chest 2 View  Result Date: 10/31/2016 CLINICAL DATA:  Cough for over a week. History of hypertension  and asthma. EXAM: CHEST  2 VIEW COMPARISON:  08/17/2016 FINDINGS: The heart size and mediastinal contours are within normal limits. Both lungs are clear. The visualized skeletal structures are unremarkable. IMPRESSION: No active cardiopulmonary disease. Electronically Signed   By: Burman NievesWilliam  Stevens M.D.   On: 10/31/2016 22:01    ____________________________________________   PROCEDURES  Procedure(s) performed:   Procedures  None ____________________________________________   INITIAL IMPRESSION / ASSESSMENT AND PLAN / ED COURSE  Pertinent labs & imaging results that were available during my care of the patient were reviewed by me and considered in my medical decision making (see chart for details).  Patient presents to the emergency department for evaluation of URI symptoms and cough. This is the third visit this week for similar symptoms. He has been taking his albuterol inhaler but continues to have cough. Chest x-ray is negative for PNA. Plan for tessalon, continued albuterol, and Azithromycin for use in 2-3 days if symptoms continue.   At this time, I do not feel there is any life-threatening condition present. I have reviewed and discussed all results (EKG, imaging, lab, urine as appropriate), exam findings with patient. I have reviewed nursing notes and appropriate previous records.  I feel the patient is safe to be discharged home without further emergent workup. Discussed usual and customary return precautions. Patient and family (if present) verbalize understanding and are comfortable with this plan.  Patient will follow-up with their primary care provider. If they do not have a primary care provider, information for follow-up has been provided to them. All questions have been answered.  ____________________________________________  FINAL CLINICAL IMPRESSION(S) / ED DIAGNOSES  Final diagnoses:  Cough  Viral upper respiratory tract infection     MEDICATIONS GIVEN DURING THIS  VISIT:  None  NEW OUTPATIENT MEDICATIONS STARTED DURING THIS VISIT:  New Prescriptions   AZITHROMYCIN (ZITHROMAX) 250 MG TABLET    Take 1 tablet (250 mg total) by mouth daily. Take first 2 tablets together, then 1 every day until finished.   BENZONATATE (TESSALON) 100 MG CAPSULE    Take 1 capsule (100 mg total) by mouth every 8 (eight) hours.    I personally performed the services described in this documentation, which was scribed in my presence. The recorded information has been reviewed and is accurate.   Note:  This document was prepared using Dragon voice recognition software and may include unintentional dictation errors.  Alona BeneJoshua Latica Hohmann, MD Emergency Medicine    Maia PlanJoshua G Chastidy Ranker, MD 10/31/16 [phone]
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
## 2017-10-15 IMAGING — CR DG CHEST 2V
2 series · 2 of 2 positions shown · non-contrast
Comparison: 08/17/2016

CLINICAL DATA: Cough for over a week. History of hypertension and
asthma.

EXAM:
CHEST  2 VIEW

[w chest pa]
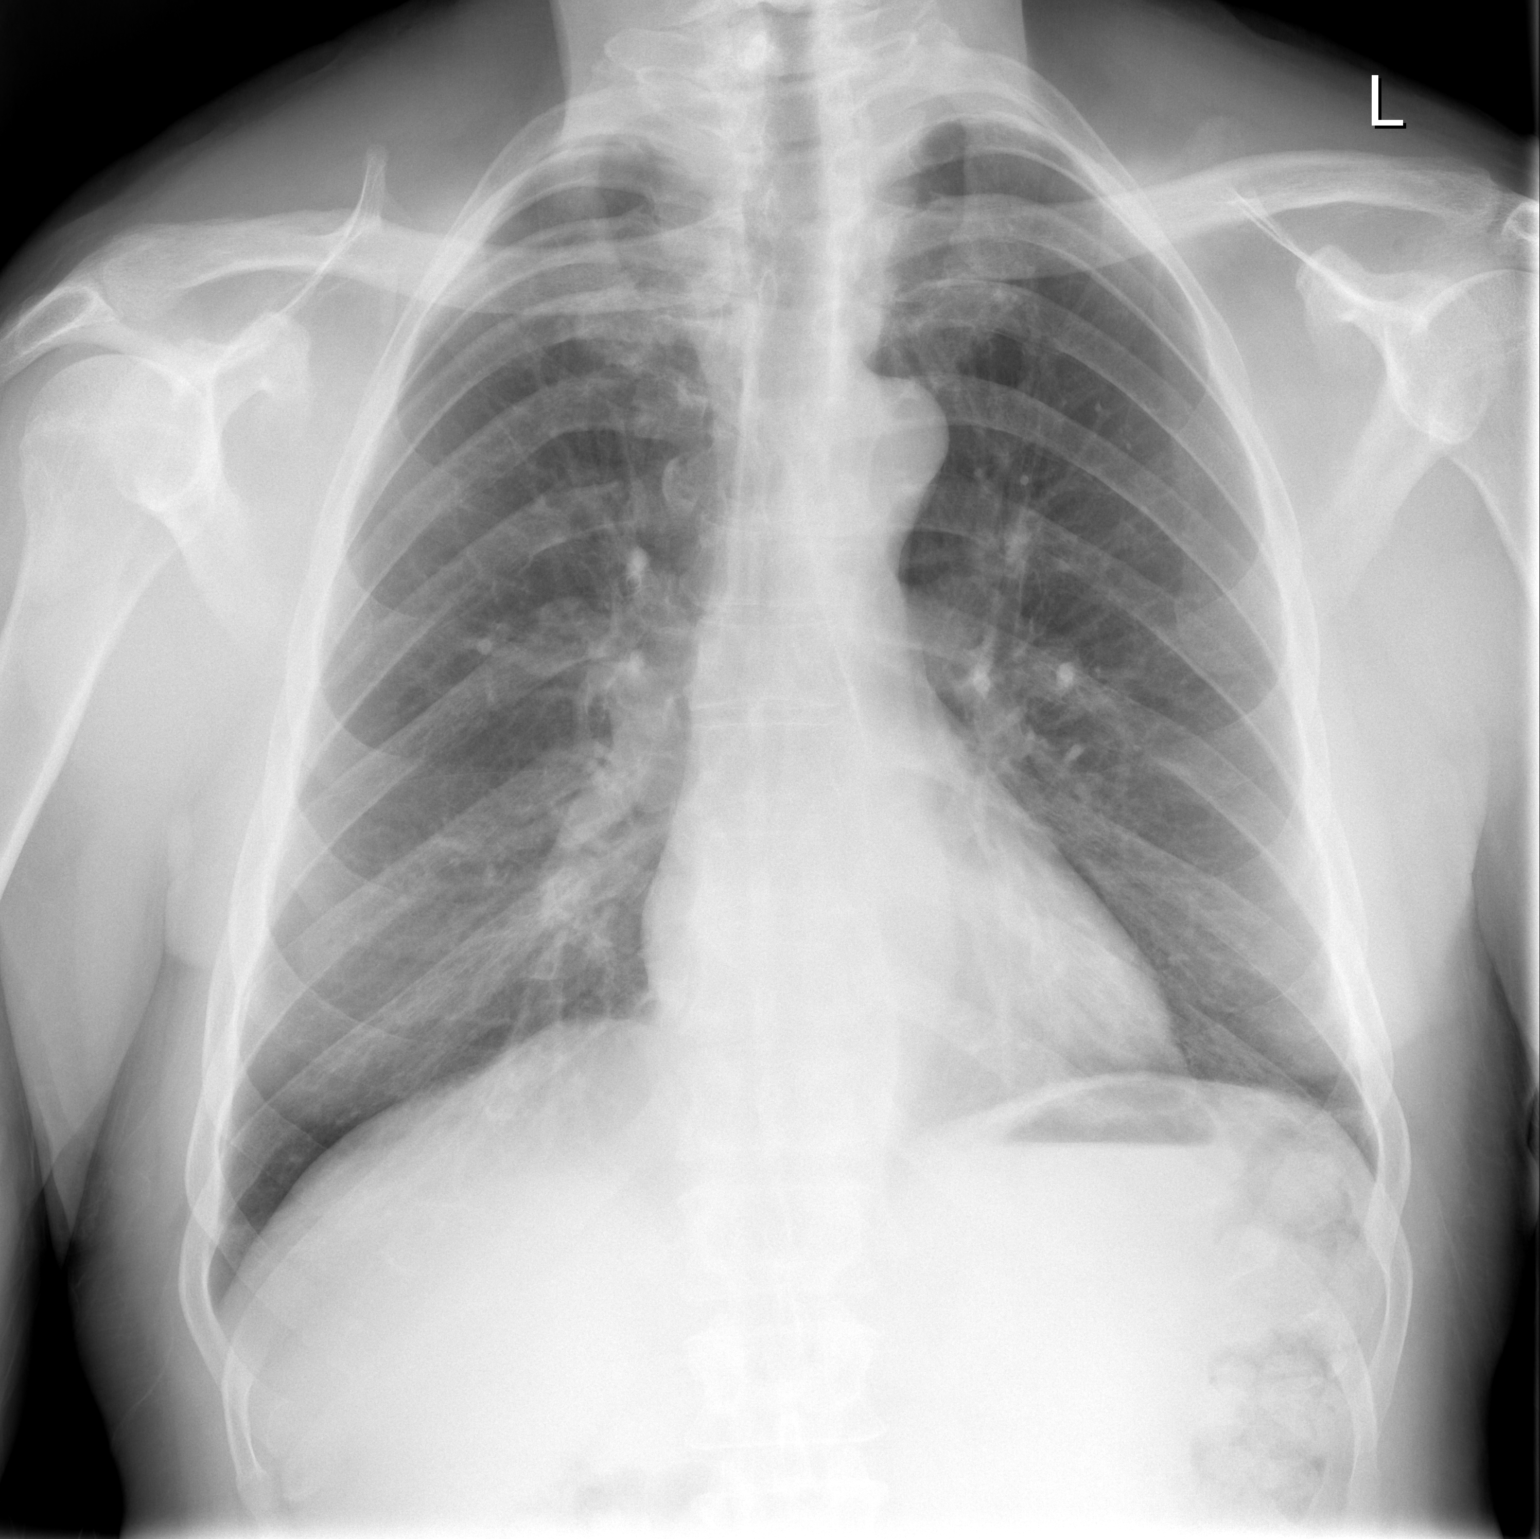

[w chest lat]
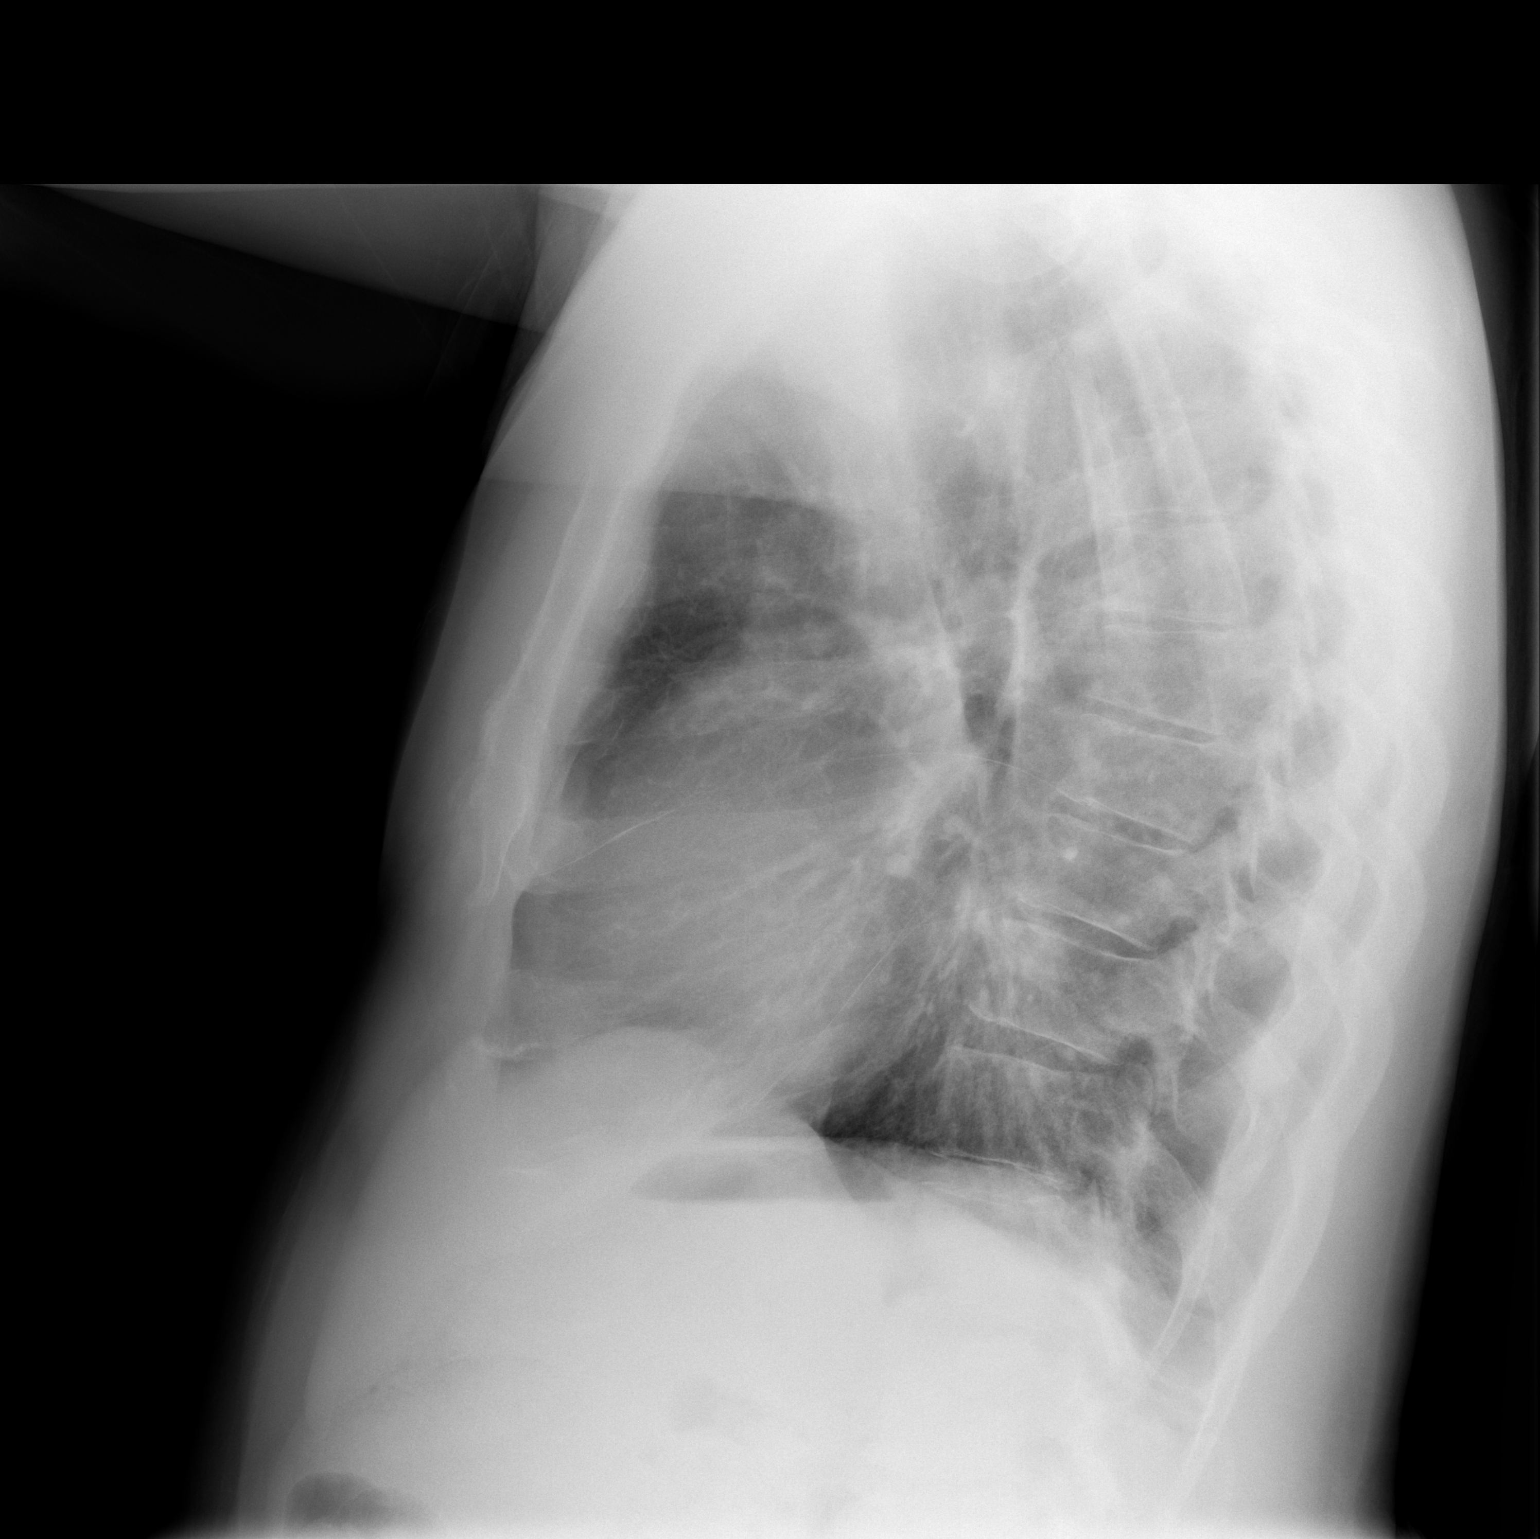

[2 of 2 positions shown; findings below may reference images not displayed]

FINDINGS: The heart size and mediastinal contours are within normal limits.
Both lungs are clear. The visualized skeletal structures are
unremarkable.
IMPRESSION: No active cardiopulmonary disease.

## 2017-11-19 ENCOUNTER — Institutional Professional Consult (permissible substitution): Payer: 59 | Admitting: Pulmonary Disease

## 2017-12-02 ENCOUNTER — Institutional Professional Consult (permissible substitution): Payer: 59 | Admitting: Internal Medicine

## 2023-05-30 ENCOUNTER — Emergency Department (HOSPITAL_BASED_OUTPATIENT_CLINIC_OR_DEPARTMENT_OTHER)
Admission: EM | Admit: 2023-05-30 | Discharge: 2023-05-30 | Disposition: A | Discharge status: Home / Self Care | Payer: 59 | Attending: Emergency Medicine | Admitting: Emergency Medicine

## 2023-05-30 ENCOUNTER — Encounter (HOSPITAL_BASED_OUTPATIENT_CLINIC_OR_DEPARTMENT_OTHER): Payer: Self-pay

## 2023-05-30 ENCOUNTER — Other Ambulatory Visit: Payer: Self-pay

## 2023-05-30 DIAGNOSIS — Z79899 Other long term (current) drug therapy: Secondary | ICD-10-CM | POA: Diagnosis not present

## 2023-05-30 DIAGNOSIS — I10 Essential (primary) hypertension: Secondary | ICD-10-CM | POA: Diagnosis not present

## 2023-05-30 DIAGNOSIS — Z7982 Long term (current) use of aspirin: Secondary | ICD-10-CM | POA: Diagnosis not present

## 2023-05-30 DIAGNOSIS — H1131 Conjunctival hemorrhage, right eye: Secondary | ICD-10-CM | POA: Insufficient documentation

## 2023-05-30 DIAGNOSIS — H5789 Other specified disorders of eye and adnexa: Secondary | ICD-10-CM | POA: Diagnosis present

## 2023-05-30 MED ORDER — FLUORESCEIN SODIUM 1 MG OP STRP
1.0000 | ORAL_STRIP | Freq: Once | OPHTHALMIC | Status: AC
  Administered 2023-05-30: 1 via OPHTHALMIC
  Filled 2023-05-30: qty 1

## 2023-05-30 MED ORDER — TETRACAINE HCL 0.5 % OP SOLN
2.0000 [drp] | Freq: Once | OPHTHALMIC | Status: AC
  Administered 2023-05-30: 2 [drp] via OPHTHALMIC
  Filled 2023-05-30: qty 4

## 2023-05-30 NOTE — Discharge Instructions (Signed)
As we discussed, you have subconjunctival hemorrhage likely from coughing and sneezing.  If you have to sneeze please cover your right nostril or sneeze with your mouth open  As we discussed, it will resolve by itself in several days  You can call ophthalmology on Monday if the redness has gotten worse  Return to ER if you have blurry vision or eye pain or double vision

## 2023-05-30 NOTE — ED Triage Notes (Signed)
The patient woke up and his right eyes is red. No pain or injury.

## 2023-05-30 NOTE — ED Provider Notes (Signed)
[facility] Provider Note   CSN: [id number] Arrival date & time: 05/30/23  1318     History  Chief Complaint  Patient presents with   Eye Problem    [facility] is a 67 y.o. male history of hypertension here presenting with right eye redness.  Patient states that he had a coughing episode yesterday.  He woke up this morning and noticed right eye redness.  Patient denies any blurry vision.  Patient states that he is not on blood thinners  The history is provided by the patient.       Home Medications Prior to Admission medications   Medication Sig Start Date End Date Taking? Authorizing Provider  albuterol (PROVENTIL) (2.5 MG/3ML) 0.083% nebulizer solution Take 3 mLs (2.5 mg total) by nebulization every 6 (six) hours as needed for wheezing or shortness of breath. 08/17/16   Shaune Pollack, MD  amLODipine (NORVASC) 5 MG tablet Take 1 tablet (5 mg total) by mouth daily. 03/10/15   Geoffery Lyons, MD  Ascorbic Acid (VITAMIN C) 100 MG tablet Take 100 mg by mouth daily.    [provider]  aspirin 81 MG tablet Take 81 mg by mouth daily.    [provider]  azithromycin (ZITHROMAX) 250 MG tablet Take 1 tablet (250 mg total) by mouth daily. Take first 2 tablets together, then 1 every day until finished. 10/31/16   Long, Arlyss Repress, MD  benzonatate (TESSALON) 100 MG capsule Take 1 capsule (100 mg total) by mouth every 8 (eight) hours. 10/31/16   Long, Arlyss Repress, MD  chlorpheniramine-HYDROcodone (TUSSIONEX PENNKINETIC ER) 10-8 MG/5ML SUER Take 5 mLs by mouth every 12 (twelve) hours as needed. 10/27/16   Molpus, Jonny Ruiz, MD  clarithromycin (BIAXIN) 500 MG tablet Take 1 tablet (500 mg total) by mouth 2 (two) times daily. 09/11/16   Palumbo, April, MD  fluticasone (FLONASE) 50 MCG/ACT nasal spray Place 2 sprays into both nostrils daily. Continue even after you feel better 09/11/16   Palumbo, April, MD  fluticasone (FLOVENT HFA) 110 MCG/ACT  inhaler Inhale 1 puff into the lungs 2 (two) times daily.    [provider]  LISINOPRIL PO Take by mouth.    [provider]      Allergies    Sulfa antibiotics    Review of Systems   Review of Systems  Eyes:  Positive for redness.  All other systems reviewed and are negative.   Physical Exam Updated Vital Signs BP 109/78   Pulse 69   Temp 98 F (36.7 C) (Oral)   Resp 16   Ht 6\' 2"  (1.88 m)   Wt 106.6 kg   SpO2 94%   BMI 30.17 kg/m  Physical Exam Vitals and nursing note reviewed.  HENT:     Head: Normocephalic.     Nose: Nose normal.     Mouth/Throat:     Mouth: Mucous membranes are moist.  Eyes:     Comments: Right eye subconjunctival hemorrhage on the medial aspect.  Patient has no obvious hyphema.  Patient's eye pressure is 12 on the right side and 20 on the left side.  Patient has no obvious corneal abrasion.  Extraocular movements are intact.  Vision is 20/20 both eyes  Cardiovascular:     Rate and Rhythm: Normal rate and regular rhythm.     Pulses: Normal pulses.     Heart sounds: Normal heart sounds.  Pulmonary:     Effort: Pulmonary effort is normal.  Breath sounds: Normal breath sounds.  Abdominal:     Palpations: Abdomen is soft.  Musculoskeletal:        General: Normal range of motion.     Cervical back: Normal range of motion and neck supple.  Skin:    General: Skin is warm.     Capillary Refill: Capillary refill takes less than 2 seconds.  Neurological:     General: No focal deficit present.     Mental Status: He is alert and oriented to person, place, and time.  Psychiatric:        Mood and Affect: Mood normal.        Behavior: Behavior normal.     ED Results / Procedures / Treatments   Labs (all labs ordered are listed, but only abnormal results are displayed) Labs Reviewed - No data to display  EKG None  Radiology No results found.  Procedures Procedures    Medications Ordered in ED Medications   tetracaine (PONTOCAINE) 0.5 % ophthalmic solution 2 drop (has no administration in time range)  fluorescein ophthalmic strip 1 strip (has no administration in time range)    ED Course/ Medical Decision Making/ A&P                                 Medical Decision Making [name] is a 67 y.o. male here presenting with right eye subconjunctival hemorrhage.  Patient has no obvious hyphema.  He has normal vision and no corneal abrasion and normal eye pressures bilaterally.  I told him that this is likely from coughing.  Will have him follow-up with Optho outpatient.   Problems Addressed: Subconjunctival hemorrhage of right eye: acute illness or injury  Risk Prescription drug management.    Final Clinical Impression(s) / ED Diagnoses Final diagnoses:  None    Rx / DC Orders ED Discharge Orders     None         Charlynne Pander, MD 05/30/23 1535
